# Patient Record
Sex: Female | Born: 1960 | Race: White | Hispanic: No | Marital: Single | Smoking: Current every day smoker
Health system: Southern US, Community
[De-identification: ages and names within clinical notes are randomized; demographics above are authoritative.]

## PROBLEM LIST (undated history)

## (undated) DIAGNOSIS — I10 Essential (primary) hypertension: Secondary | ICD-10-CM

## (undated) DIAGNOSIS — T8859XA Other complications of anesthesia, initial encounter: Secondary | ICD-10-CM

## (undated) DIAGNOSIS — F329 Major depressive disorder, single episode, unspecified: Secondary | ICD-10-CM

## (undated) DIAGNOSIS — F32A Depression, unspecified: Secondary | ICD-10-CM

## (undated) DIAGNOSIS — E785 Hyperlipidemia, unspecified: Secondary | ICD-10-CM

## (undated) HISTORY — PX: TUBAL LIGATION: SHX77

## (undated) HISTORY — DX: Depression, unspecified: F32.A

## (undated) HISTORY — PX: TONSILLECTOMY: SUR1361

## (undated) HISTORY — DX: Hyperlipidemia, unspecified: E78.5

---
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
## 2019-05-08 ENCOUNTER — Other Ambulatory Visit: Payer: Self-pay

## 2019-05-08 ENCOUNTER — Ambulatory Visit
Admission: EM | Admit: 2019-05-08 | Discharge: 2019-05-08 | Disposition: A | Discharge status: Home / Self Care | Payer: BLUE CROSS/BLUE SHIELD | Attending: Emergency Medicine | Admitting: Emergency Medicine

## 2019-05-08 DIAGNOSIS — Z20828 Contact with and (suspected) exposure to other viral communicable diseases: Secondary | ICD-10-CM | POA: Diagnosis not present

## 2019-05-08 DIAGNOSIS — I10 Essential (primary) hypertension: Secondary | ICD-10-CM

## 2019-05-08 DIAGNOSIS — Z20822 Contact with and (suspected) exposure to covid-19: Secondary | ICD-10-CM

## 2019-05-08 DIAGNOSIS — R0602 Shortness of breath: Secondary | ICD-10-CM

## 2019-05-08 HISTORY — DX: Essential (primary) hypertension: I10

## 2019-05-08 LAB — POC SARS CORONAVIRUS 2 AG -  ED: SARS Coronavirus 2 Ag: NEGATIVE

## 2019-05-08 MED ORDER — ALBUTEROL SULFATE HFA 108 (90 BASE) MCG/ACT IN AERS: 1.0000 | INHALATION_SPRAY | Freq: Four times a day (QID) | RESPIRATORY_TRACT | 0 refills | Status: DC | PRN

## 2019-05-08 MED ORDER — PREDNISONE 10 MG (21) PO TBPK: ORAL_TABLET | Freq: Every day | ORAL | 0 refills | Status: DC

## 2019-05-08 MED ORDER — AMLODIPINE BESYLATE 5 MG PO TABS: 5.0000 mg | ORAL_TABLET | Freq: Every day | ORAL | 1 refills | Status: DC

## 2019-05-08 NOTE — Discharge Instructions (Addendum)
Rapid COVID negative.  Culture sent.  Patient should remain in quarantine until they have received culture results.  If negative you may resume normal activities (go back to work/school) while practicing hand hygiene, social distance, and mask wearing.  If positive, patient should remain in quarantine for 10 days from symptom onset AND greater than 72 hours after symptoms resolution (absence of fever without the use of fever-reducing medication and improvement in respiratory symptoms), whichever is longer Get plenty of rest and push fluids Use OTC zyrtec for nasal congestion, runny nose, and/or sore throat Use OTC flonase for nasal congestion and runny nose Use OTC medications like ibuprofen or tylenol as needed fever or pain Prednisone taper prescribed.  Take as directed and to completion Albuterol inhaler prescribed.  Use as needed for shortness of breath and/or wheezing Follow up with PCP regarding test results and for recheck in a few days Call or go to the ED if you have any new or worsening symptoms such as fever, worsening cough, shortness of breath, chest tightness, chest pain, turning blue, changes in mental status, etc..Marland Kitchen

## 2019-05-08 NOTE — ED Triage Notes (Signed)
Pt presents with c/o fatigue and cough  That developed a couple days ago, pt also states she has had issues with high blood pressure and has been taking her brother n laws losartan

## 2019-05-08 NOTE — ED Provider Notes (Signed)
[facility]   [id number] 05/08/19 Arrival Time: 0998   CC: COVID symptoms; elevated blood pressure  SUBJECTIVE: History from: patient.  Monique Harmon is a 58 y.o. female hx significant for HTN, who presents with fatigue, mildly productive cough, and SOB x couple of days.  Denies sick exposure to COVID, flu or strep.  Denies recent travel.  Has NOT tried OTC medication.  Symptoms are made worse with in the morning.  Reports previous symptoms in the past.   Denies fever, chills, fatigue, sinus pain, rhinorrhea, sore throat, wheezing, chest pain, nausea, changes in bowel or bladder habits.    Patient also mentions concern for elevated blood pressure.  Has been taking brother-in-laws losartan 50 mg with improvement.  Hx of HTN for "years."  Has tried lisinopril, HCTZ, and now losartan.  Does not have a PCP at this time.  States diastolic BP has been as high as 118.  Denies headache, vision changes, chest pain, numbness, tingling, weakness, facial droop, slurred speech.    ROS: As per HPI.  All other pertinent ROS negative.     Past Medical History:  Diagnosis Date  . Hypertension    Past Surgical History:  Procedure Laterality Date  . TUBAL LIGATION     Allergies  Allergen Reactions  . Neosporin [Bacitracin-Polymyxin B]    No current facility-administered medications on file prior to encounter.    No current outpatient medications on file prior to encounter.   Social History   Socioeconomic History  . Marital status: Single    Spouse name: Not on file  . Number of children: Not on file  . Years of education: Not on file  . Highest education level: Not on file  Occupational History  . Not on file  Social Needs  . Financial resource strain: Not on file  . Food insecurity    Worry: Not on file    Inability: Not on file  . Transportation needs    Medical: Not on file    Non-medical: Not on file  Tobacco Use  . Smoking status: Current Every Day Smoker   Packs/day: 1.00    Types: Cigarettes  . Smokeless tobacco: Never Used  Substance and Sexual Activity  . Alcohol use: Yes  . Drug use: Not Currently  . Sexual activity: Not on file  Lifestyle  . Physical activity    Days per week: Not on file    Minutes per session: Not on file  . Stress: Not on file  Relationships  . Social Herbalist on phone: Not on file    Gets together: Not on file    Attends religious service: Not on file    Active member of club or organization: Not on file    Attends meetings of clubs or organizations: Not on file    Relationship status: Not on file  . Intimate partner violence    Fear of current or ex partner: Not on file    Emotionally abused: Not on file    Physically abused: Not on file    Forced sexual activity: Not on file  Other Topics Concern  . Not on file  Social History Narrative  . Not on file   Family History  Problem Relation Age of Onset  . Cancer Mother   . COPD Mother   . Diabetes Mother   . COPD Father     OBJECTIVE:  Vitals:   05/08/19 1525  BP: (!) 134/91  Pulse: 97  Resp: 18  Temp: 99 F (37.2 C)  SpO2: 90%     General appearance: alert; well-appearing nontoxic; speaking in full sentences and tolerating own secretions HEENT: NCAT; Ears: EACs clear, TMs pearly gray; Eyes: PERRL.  EOM grossly intact. Nose: nares patent without rhinorrhea, Throat: oropharynx clear, tonsils non erythematous or enlarged, uvula midline  Neck: supple without LAD Lungs: unlabored respirations, symmetrical air entry; cough: mild; no respiratory distress; decreased breath sounds throughout Heart: regular rate and rhythm.   Skin: warm and dry Psychological: alert and cooperative; normal mood and affect  LABS:  Results for orders placed or performed during the hospital encounter of 05/08/19 (from the past 24 hour(s))  POC SARS Coronavirus 2 Ag-ED - Nasal Swab (BD Veritor Kit)     Status: None   Collection Time: 05/08/19  3:39 PM   Result Value Ref Range   SARS Coronavirus 2 Ag Negative Negative     ASSESSMENT & PLAN:  1. Suspected COVID-19 virus infection   2. Shortness of breath   3. Essential hypertension     Meds ordered this encounter  Medications  . predniSONE (STERAPRED UNI-PAK 21 TAB) 10 MG (21) TBPK tablet    Sig: Take by mouth daily. Take 6 tabs by mouth daily  for 2 days, then 5 tabs for 2 days, then 4 tabs for 2 days, then 3 tabs for 2 days, 2 tabs for 2 days, then 1 tab by mouth daily for 2 days    Dispense:  42 tablet    Refill:  0    Order Specific Question:   Supervising Provider    Answer:   Raylene Everts [[id number]]  . albuterol (VENTOLIN HFA) 108 (90 Base) MCG/ACT inhaler    Sig: Inhale 1-2 puffs into the lungs every 6 (six) hours as needed for wheezing or shortness of breath.    Dispense:  18 g    Refill:  0    Order Specific Question:   Supervising Provider    Answer:   Raylene Everts [[id number]]  . amLODipine (NORVASC) 5 MG tablet    Sig: Take 1 tablet (5 mg total) by mouth daily.    Dispense:  30 tablet    Refill:  1    Order Specific Question:   Supervising Provider    Answer:   Raylene Everts [[id number]]   COVID symptoms:  Rapid COVID negative.  Culture sent.  Patient should remain in quarantine until they have received culture results.  If negative you may resume normal activities (go back to work/school) while practicing hand hygiene, social distance, and mask wearing.  If positive, patient should remain in quarantine for 10 days from symptom onset AND greater than 72 hours after symptoms resolution (absence of fever without the use of fever-reducing medication and improvement in respiratory symptoms), whichever is longer Get plenty of rest and push fluids Use OTC zyrtec for nasal congestion, runny nose, and/or sore throat Use OTC flonase for nasal congestion and runny nose Use OTC medications like ibuprofen or tylenol as needed fever or pain Prednisone taper prescribed.   Take as directed and to completion Albuterol inhaler prescribed.  Use as needed for shortness of breath and/or wheezing Follow up with PCP regarding test results and for recheck in a few days Call or go to the ED if you have any new or worsening symptoms such as fever, worsening cough, shortness of breath, chest tightness, chest pain, turning blue, changes in mental status, etc...    HTN:  Instructed patient on the following: Please continue to monitor blood pressure at home and keep a log Declines blood work today Will start her out on amlodipine daily. Stress importance of establishing care with PCP for further management of elevated blood pressure.  Patient aware and in agreement, will try to make appt with Dr. Holly Bodily in the next few weeks Return or go to the ED if you have any new or worsening symptoms such as vision changes, fatigue, dizziness, chest pain, shortness of breath, nausea, swelling in your hands or feet, urinary symptoms, etc...    Reviewed expectations re: course of current medical issues. Questions answered. Outlined signs and symptoms indicating need for more acute intervention. Patient verbalized understanding. After Visit Summary given.         Lestine Box, PA-C 05/08/19 1623

## 2019-05-09 LAB — NOVEL CORONAVIRUS, NAA: SARS-CoV-2, NAA: NOT DETECTED

## 2019-08-09 ENCOUNTER — Ambulatory Visit: Payer: Medicaid Other | Admitting: Physician Assistant

## 2019-08-09 ENCOUNTER — Encounter: Payer: Self-pay | Admitting: Physician Assistant

## 2019-08-09 ENCOUNTER — Other Ambulatory Visit: Payer: Self-pay

## 2019-08-09 VITALS — BP 146/88 | HR 88 | Temp 98.1°F | Wt 206.6 lb

## 2019-08-09 DIAGNOSIS — Z13220 Encounter for screening for lipoid disorders: Secondary | ICD-10-CM

## 2019-08-09 DIAGNOSIS — Z1211 Encounter for screening for malignant neoplasm of colon: Secondary | ICD-10-CM

## 2019-08-09 DIAGNOSIS — Z5320 Procedure and treatment not carried out because of patient's decision for unspecified reasons: Secondary | ICD-10-CM

## 2019-08-09 DIAGNOSIS — F17200 Nicotine dependence, unspecified, uncomplicated: Secondary | ICD-10-CM

## 2019-08-09 DIAGNOSIS — I10 Essential (primary) hypertension: Secondary | ICD-10-CM

## 2019-08-09 DIAGNOSIS — E669 Obesity, unspecified: Secondary | ICD-10-CM

## 2019-08-09 DIAGNOSIS — Z2821 Immunization not carried out because of patient refusal: Secondary | ICD-10-CM

## 2019-08-09 DIAGNOSIS — F1090 Alcohol use, unspecified, uncomplicated: Secondary | ICD-10-CM

## 2019-08-09 DIAGNOSIS — Z131 Encounter for screening for diabetes mellitus: Secondary | ICD-10-CM

## 2019-08-09 DIAGNOSIS — Z1239 Encounter for other screening for malignant neoplasm of breast: Secondary | ICD-10-CM

## 2019-08-09 DIAGNOSIS — Z789 Other specified health status: Secondary | ICD-10-CM

## 2019-08-09 DIAGNOSIS — Z7689 Persons encountering health services in other specified circumstances: Secondary | ICD-10-CM

## 2019-08-09 MED ORDER — AMLODIPINE BESYLATE 5 MG PO TABS: 5.0000 mg | ORAL_TABLET | Freq: Every day | ORAL | 1 refills | Status: DC

## 2019-08-09 NOTE — Patient Instructions (Signed)
Varenicline oral tablets What is this medicine? VARENICLINE (var e NI kleen) is used to help people quit smoking. It is used with a patient support program recommended by your physician. This medicine may be used for other purposes; ask your health care provider or pharmacist if you have questions. COMMON BRAND NAME(S): Chantix What should I tell my health care provider before I take this medicine? They need to know if you have any of these conditions:  heart disease  if you often drink alcohol  kidney disease  mental illness  on hemodialysis  seizures  history of stroke  suicidal thoughts, plans, or attempt; a previous suicide attempt by you or a family member  an unusual or allergic reaction to varenicline, other medicines, foods, dyes, or preservatives  pregnant or trying to get pregnant  breast-feeding How should I use this medicine? Take this medicine by mouth after eating. Take with a full glass of water. Follow the directions on the prescription label. Take your doses at regular intervals. Do not take your medicine more often than directed. There are 3 ways you can use this medicine to help you quit smoking; talk to your health care professional to decide which plan is right for you: 1) you can choose a quit date and start this medicine 1 week before the quit date, or, 2) you can start taking this medicine before you choose a quit date, and then pick a quit date between day 8 and 35 days of treatment, or, 3) if you are not sure that you are able or willing to quit smoking right away, start taking this medicine and slowly decrease the amount you smoke as directed by your health care professional with the goal of being cigarette-free by week 12 of treatment. Stick to your plan; ask about support groups or other ways to help you remain cigarette-free. If you are motivated to quit smoking and did not succeed during a previous attempt with this medicine for reasons other than  side effects, or if you returned to smoking after this treatment, speak with your health care professional about whether another course of this medicine may be right for you. A special MedGuide will be given to you by the pharmacist with each prescription and refill. Be sure to read this information carefully each time. Talk to your pediatrician regarding the use of this medicine in children. This medicine is not approved for use in children. Overdosage: If you think you have taken too much of this medicine contact a poison control center or emergency room at once. NOTE: This medicine is only for you. Do not share this medicine with others. What if I miss a dose? If you miss a dose, take it as soon as you can. If it is almost time for your next dose, take only that dose. Do not take double or extra doses. What may interact with this medicine?  alcohol  insulin  other medicines used to help people quit smoking  theophylline  warfarin This list may not describe all possible interactions. Give your health care provider a list of all the medicines, herbs, non-prescription drugs, or dietary supplements you use. Also tell them if you smoke, drink alcohol, or use illegal drugs. Some items may interact with your medicine. What should I watch for while using this medicine? It is okay if you do not succeed at your attempt to quit and have a cigarette. You can still continue your quit attempt and keep using this medicine as directed.   Just throw away your cigarettes and get back to your quit plan. Talk to your health care provider before using other treatments to quit smoking. Using this medicine with other treatments to quit smoking may increase the risk for side effects compared to using a treatment alone. You may get drowsy or dizzy. Do not drive, use machinery, or do anything that needs mental alertness until you know how this medicine affects you. Do not stand or sit up quickly, especially if you are  an older patient. This reduces the risk of dizzy or fainting spells. Decrease the number of alcoholic beverages that you drink during treatment with this medicine until you know if this medicine affects your ability to tolerate alcohol. Some people have experienced increased drunkenness (intoxication), unusual or sometimes aggressive behavior, or no memory of things that have happened (amnesia) during treatment with this medicine. Sleepwalking can happen during treatment with this medicine, and can sometimes lead to behavior that is harmful to you, other people, or property. Stop taking this medicine and tell your doctor if you start sleepwalking or have other unusual sleep-related activity. After taking this medicine, you may get up out of bed and do an activity that you do not know you are doing. The next morning, you may have no memory of this. Activities include driving a car ("sleep-driving"), making and eating food, talking on the phone, sexual activity, and sleep-walking. Serious injuries have occurred. Stop the medicine and call your doctor right away if you find out you have done any of these activities. Do not take this medicine if you have used alcohol that evening. Do not take it if you have taken another medicine for sleep. The risk of doing these sleep-related activities is higher. Patients and their families should watch out for new or worsening depression or thoughts of suicide. Also watch out for sudden changes in feelings such as feeling anxious, agitated, panicky, irritable, hostile, aggressive, impulsive, severely restless, overly excited and hyperactive, or not being able to sleep. If this happens, call your health care professional. If you have diabetes and you quit smoking, the effects of insulin may be increased and you may need to reduce your insulin dose. Check with your doctor or health care professional about how you should adjust your insulin dose. What side effects may I notice  from receiving this medicine? Side effects that you should report to your doctor or health care professional as soon as possible:  allergic reactions like skin rash, itching or hives, swelling of the face, lips, tongue, or throat  acting aggressive, being angry or violent, or acting on dangerous impulses  breathing problems  changes in emotions or moods  chest pain or chest tightness  feeling faint or lightheaded, falls  hallucination, loss of contact with reality  mouth sores  redness, blistering, peeling or loosening of the skin, including inside the mouth  signs and symptoms of a stroke like changes in vision; confusion; trouble speaking or understanding; severe headaches; sudden numbness or weakness of the face, arm or leg; trouble walking; dizziness; loss of balance or coordination  seizures  sleepwalking  suicidal thoughts or other mood changes Side effects that usually do not require medical attention (report to your doctor or health care professional if they continue or are bothersome):  constipation  gas  headache  nausea, vomiting  strange dreams  trouble sleeping This list may not describe all possible side effects. Call your doctor for medical advice about side effects. You may report side   effects to FDA at 1-800-FDA-1088. Where should I keep my medicine? Keep out of the reach of children. Store at room temperature between 15 and 30 degrees C (59 and 86 degrees F). Throw away any unused medicine after the expiration date. NOTE: This sheet is a summary. It may not cover all possible information. If you have questions about this medicine, talk to your doctor, pharmacist, or health care provider.  2020 Elsevier/Gold Standard (2018-05-05 14:27:36)  

## 2019-08-09 NOTE — Progress Notes (Signed)
BP (!) 146/88   Pulse 88   Temp 98.1 F (36.7 C)   Wt 206 lb 9.6 oz (93.7 kg)   SpO2 91%    Subjective:    Patient ID: Monique Harmon, female    DOB: 1960/11/26, 59 y.o.   MRN: [id number]  HPI: Monique Harmon is a 59 y.o. female presenting on 08/09/2019 for New Patient (Initial Visit) (previous pt in office in [place] pt moved to the area July 2020 and has not been anywhere other than an urgent care in Jan. 2021)   HPI  Pt had a negative covid 19 screening questionnaire    Pt is a 58yoF who presents to office today to establish care.  She Works at [employer] at [employer] on [address]    Pt was on HTN medication but she ran out.  She was on lisinopril in the past and it made her cough.   Pt says she was on losartan but she thought it bothered her breathing.  She was seen for this at urgent care in December.  Notes reviewed and discussed with pt that it is more likely she had a cold or some bronchitis.  Pt states understanding  Pt Drinks 4-7 beer /day  She states No mammogram for  6 years, No pap in over 10 year and she has never had a colonoscopy.  Pt reports a numb spot on her left lateral thigh that is about the size of her hand (open).  She denies injury to the area. It does not hurt.     Relevant past medical, surgical, family and social history reviewed and updated as indicated. Interim medical history since our last visit reviewed. Allergies and medications reviewed and updated.   CURRENT MEDS: none   Review of Systems  Per HPI unless specifically indicated above     Objective:    BP (!) 146/88   Pulse 88   Temp 98.1 F (36.7 C)   Wt 206 lb 9.6 oz (93.7 kg)   SpO2 91%   Wt Readings from Last 3 Encounters:  08/09/19 206 lb 9.6 oz (93.7 kg)    Physical Exam Vitals reviewed.  Constitutional:      General: She is not in acute distress.    Appearance: She is well-developed. She is obese. She is not ill-appearing.  HENT:     Head: Normocephalic  and atraumatic.  Eyes:     Conjunctiva/sclera: Conjunctivae normal.     Pupils: Pupils are equal, round, and reactive to light.  Neck:     Thyroid: No thyromegaly.  Cardiovascular:     Rate and Rhythm: Normal rate and regular rhythm.     Pulses:          Dorsalis pedis pulses are 2+ on the right side and 2+ on the left side.  Pulmonary:     Effort: Pulmonary effort is normal.     Breath sounds: Normal breath sounds.  Abdominal:     General: Bowel sounds are normal.     Palpations: Abdomen is soft. There is no mass.     Tenderness: There is no abdominal tenderness.  Musculoskeletal:     Cervical back: Neck supple.     Right upper leg: Normal.     Left upper leg: Normal. No swelling, edema, deformity, lacerations, tenderness or bony tenderness.     Left knee: Normal.     Right lower leg: No edema.     Left lower leg: Normal. No edema.  Comments: There is a small area approx 3 inch diameter on left lateral thigh with diminished sensation to light touch.  There is no bruising or other abnormality.  Lymphadenopathy:     Cervical: No cervical adenopathy.  Skin:    General: Skin is warm and dry.  Neurological:     Mental Status: She is alert and oriented to person, place, and time.     Gait: Gait normal.  Psychiatric:        Attention and Perception: Attention normal.        Speech: Speech normal.        Behavior: Behavior normal. Behavior is cooperative.     Comments: Pt somewhat reluctant ie she didn't want to remove pants, socks and shoes for evaluation of her "numb spot" but did after somewhat lengthy explanation as to why this was necessary.           Assessment & Plan:   Encounter Diagnoses  Name Primary?  . Encounter to establish care Yes  . Essential hypertension   . Heavy alcohol consumption   . Tobacco use disorder   . Obesity, unspecified classification, unspecified obesity type, unspecified whether serious comorbidity present   . Encounter for screening  for malignant neoplasm of breast, unspecified screening modality   . Screening for colon cancer   . COVID-19 virus vaccination declined   . Screening cholesterol level   . Screening for diabetes mellitus   . Papanicolaou smear declined       -rx amlodipne -will get Baseline labs -will refer for screening Mammogram -pt is given ifobt for colon cancer screening -Pt declined covid vaccination -will Update pap soon but pt doesn't want to do it at follow up appointment in one month. -urged pt to decrease etoh.  Discussed risks of heavy drinking.  Recommended no more than 2/day and would be better to drink no more than 2 or 3 days/week.  Discussed no etoh would be best option. -encouraged smoking cessation.  Pt asks about chantix (on way out the door).  Will discuss further at next appointment.  She is given reading information on chantix. -pt to follow up 1 month.  She is to contact office sooner prn

## 2019-08-10 ENCOUNTER — Other Ambulatory Visit: Payer: Self-pay | Admitting: Physician Assistant

## 2019-08-10 DIAGNOSIS — Z1211 Encounter for screening for malignant neoplasm of colon: Secondary | ICD-10-CM

## 2019-08-19 ENCOUNTER — Other Ambulatory Visit: Payer: Self-pay | Admitting: Student

## 2019-08-19 DIAGNOSIS — Z1239 Encounter for other screening for malignant neoplasm of breast: Secondary | ICD-10-CM

## 2019-08-29 ENCOUNTER — Other Ambulatory Visit: Payer: Self-pay

## 2019-08-29 ENCOUNTER — Other Ambulatory Visit (HOSPITAL_COMMUNITY)
Admission: RE | Admit: 2019-08-29 | Discharge: 2019-08-29 | Disposition: A | Discharge status: Home / Self Care | Payer: Medicaid Other | Source: Ambulatory Visit | Attending: Physician Assistant | Admitting: Physician Assistant

## 2019-08-29 DIAGNOSIS — Z789 Other specified health status: Secondary | ICD-10-CM | POA: Insufficient documentation

## 2019-08-29 DIAGNOSIS — I10 Essential (primary) hypertension: Secondary | ICD-10-CM | POA: Insufficient documentation

## 2019-08-29 DIAGNOSIS — Z131 Encounter for screening for diabetes mellitus: Secondary | ICD-10-CM | POA: Insufficient documentation

## 2019-08-29 DIAGNOSIS — F1090 Alcohol use, unspecified, uncomplicated: Secondary | ICD-10-CM

## 2019-08-29 DIAGNOSIS — Z13220 Encounter for screening for lipoid disorders: Secondary | ICD-10-CM

## 2019-08-29 LAB — COMPREHENSIVE METABOLIC PANEL
ALT: 22 U/L (ref 0–44)
AST: 19 U/L (ref 15–41)
Albumin: 4.1 g/dL (ref 3.5–5.0)
Alkaline Phosphatase: 68 U/L (ref 38–126)
Anion gap: 11 (ref 5–15)
BUN: 13 mg/dL (ref 6–20)
CO2: 30 mmol/L (ref 22–32)
Calcium: 9.2 mg/dL (ref 8.9–10.3)
Chloride: 97 mmol/L — ABNORMAL LOW (ref 98–111)
Creatinine, Ser: 0.62 mg/dL (ref 0.44–1.00)
GFR calc Af Amer: >60 mL/min (ref >60)
GFR calc non Af Amer: >60 mL/min (ref >60)
Glucose, Bld: 135 mg/dL — ABNORMAL HIGH (ref 70–99)
Potassium: 4.3 mmol/L (ref 3.5–5.1)
Sodium: 138 mmol/L (ref 135–145)
Total Bilirubin: 0.6 mg/dL (ref 0.3–1.2)
Total Protein: 7.3 g/dL (ref 6.5–8.1)

## 2019-08-29 LAB — LIPID PANEL
Cholesterol: 224 mg/dL — ABNORMAL HIGH (ref 0–200)
HDL: 59 mg/dL (ref >40)
LDL Cholesterol: 138 mg/dL — ABNORMAL HIGH (ref 0–99)
Total CHOL/HDL Ratio: 3.8 RATIO
Triglycerides: 137 mg/dL (ref <150)
VLDL: 27 mg/dL (ref 0–40)

## 2019-08-29 LAB — HEMOGLOBIN A1C
Hgb A1c MFr Bld: 5.9 % — ABNORMAL HIGH (ref 4.8–5.6)
Mean Plasma Glucose: 122.63 mg/dL

## 2019-08-31 ENCOUNTER — Ambulatory Visit (HOSPITAL_COMMUNITY)
Admission: RE | Admit: 2019-08-31 | Discharge: 2019-08-31 | Disposition: A | Discharge status: Home / Self Care | Payer: Self-pay | Source: Ambulatory Visit | Attending: Physician Assistant | Admitting: Physician Assistant

## 2019-08-31 ENCOUNTER — Other Ambulatory Visit: Payer: Self-pay

## 2019-08-31 DIAGNOSIS — Z1239 Encounter for other screening for malignant neoplasm of breast: Secondary | ICD-10-CM | POA: Insufficient documentation

## 2019-09-03 ENCOUNTER — Other Ambulatory Visit (HOSPITAL_COMMUNITY): Payer: Self-pay | Admitting: Physician Assistant

## 2019-09-03 DIAGNOSIS — R928 Other abnormal and inconclusive findings on diagnostic imaging of breast: Secondary | ICD-10-CM

## 2019-09-11 ENCOUNTER — Inpatient Hospital Stay
Admission: RE | Admit: 2019-09-11 | Discharge: 2019-09-11 | Disposition: A | Discharge status: Home / Self Care | Payer: Self-pay | Source: Ambulatory Visit | Attending: Physician Assistant | Admitting: Physician Assistant

## 2019-09-11 ENCOUNTER — Other Ambulatory Visit (HOSPITAL_COMMUNITY): Payer: Self-pay | Admitting: Physician Assistant

## 2019-09-11 DIAGNOSIS — R928 Other abnormal and inconclusive findings on diagnostic imaging of breast: Secondary | ICD-10-CM

## 2019-09-19 ENCOUNTER — Ambulatory Visit: Payer: Medicaid Other | Admitting: Physician Assistant

## 2019-09-19 ENCOUNTER — Encounter: Payer: Self-pay | Admitting: Physician Assistant

## 2019-09-19 ENCOUNTER — Other Ambulatory Visit: Payer: Self-pay

## 2019-09-19 VITALS — BP 136/76 | HR 91 | Temp 98.4°F | Ht 64.0 in | Wt 204.3 lb

## 2019-09-19 DIAGNOSIS — I10 Essential (primary) hypertension: Secondary | ICD-10-CM

## 2019-09-19 DIAGNOSIS — E785 Hyperlipidemia, unspecified: Secondary | ICD-10-CM

## 2019-09-19 DIAGNOSIS — Z789 Other specified health status: Secondary | ICD-10-CM

## 2019-09-19 DIAGNOSIS — F17200 Nicotine dependence, unspecified, uncomplicated: Secondary | ICD-10-CM

## 2019-09-19 DIAGNOSIS — E669 Obesity, unspecified: Secondary | ICD-10-CM

## 2019-09-19 DIAGNOSIS — F1090 Alcohol use, unspecified, uncomplicated: Secondary | ICD-10-CM

## 2019-09-19 DIAGNOSIS — R7303 Prediabetes: Secondary | ICD-10-CM

## 2019-09-19 MED ORDER — AMLODIPINE BESYLATE 5 MG PO TABS: 5.0000 mg | ORAL_TABLET | Freq: Every day | ORAL | 1 refills | Status: DC

## 2019-09-19 MED ORDER — ATORVASTATIN CALCIUM 20 MG PO TABS: 20.0000 mg | ORAL_TABLET | Freq: Every day | ORAL | 1 refills | Status: DC

## 2019-09-19 NOTE — Patient Instructions (Addendum)
Varenicline oral tablets     What is this medicine? VARENICLINE (var e NI kleen) is used to help people quit smoking. It is used with a patient support program recommended by your physician. This medicine may be used for other purposes; ask your health care provider or pharmacist if you have questions. COMMON BRAND NAME(S): Chantix What should I tell my health care provider before I take this medicine? They need to know if you have any of these conditions:  heart disease  if you often drink alcohol  kidney disease  mental illness  on hemodialysis  seizures  history of stroke  suicidal thoughts, plans, or attempt; a previous suicide attempt by you or a family member  an unusual or allergic reaction to varenicline, other medicines, foods, dyes, or preservatives  pregnant or trying to get pregnant  breast-feeding How should I use this medicine? Take this medicine by mouth after eating. Take with a full glass of water. Follow the directions on the prescription label. Take your doses at regular intervals. Do not take your medicine more often than directed. There are 3 ways you can use this medicine to help you quit smoking; talk to your health care professional to decide which plan is right for you: 1) you can choose a quit date and start this medicine 1 week before the quit date, or, 2) you can start taking this medicine before you choose a quit date, and then pick a quit date between day 8 and 35 days of treatment, or, 3) if you are not sure that you are able or willing to quit smoking right away, start taking this medicine and slowly decrease the amount you smoke as directed by your health care professional with the goal of being cigarette-free by week 12 of treatment. Stick to your plan; ask about support groups or other ways to help you remain cigarette-free. If you are motivated to quit smoking and did not succeed during a previous attempt with this medicine for reasons  other than side effects, or if you returned to smoking after this treatment, speak with your health care professional about whether another course of this medicine may be right for you. A special MedGuide will be given to you by the pharmacist with each prescription and refill. Be sure to read this information carefully each time. Talk to your pediatrician regarding the use of this medicine in children. This medicine is not approved for use in children. Overdosage: If you think you have taken too much of this medicine contact a poison control center or emergency room at once. NOTE: This medicine is only for you. Do not share this medicine with others. What if I miss a dose? If you miss a dose, take it as soon as you can. If it is almost time for your next dose, take only that dose. Do not take double or extra doses. What may interact with this medicine?  alcohol  insulin  other medicines used to help people quit smoking  theophylline  warfarin This list may not describe all possible interactions. Give your health care provider a list of all the medicines, herbs, non-prescription drugs, or dietary supplements you use. Also tell them if you smoke, drink alcohol, or use illegal drugs. Some items may interact with your medicine. What should I watch for while using this medicine? It is okay if you do not succeed at your attempt to quit and have a cigarette. You can still continue your quit  attempt and keep using this medicine as directed. Just throw away your cigarettes and get back to your quit plan. Talk to your health care provider before using other treatments to quit smoking. Using this medicine with other treatments to quit smoking may increase the risk for side effects compared to using a treatment alone. You may get drowsy or dizzy. Do not drive, use machinery, or do anything that needs mental alertness until you know how this medicine affects you. Do not stand or sit up quickly, especially  if you are an older patient. This reduces the risk of dizzy or fainting spells. Decrease the number of alcoholic beverages that you drink during treatment with this medicine until you know if this medicine affects your ability to tolerate alcohol. Some people have experienced increased drunkenness (intoxication), unusual or sometimes aggressive behavior, or no memory of things that have happened (amnesia) during treatment with this medicine. Sleepwalking can happen during treatment with this medicine, and can sometimes lead to behavior that is harmful to you, other people, or property. Stop taking this medicine and tell your doctor if you start sleepwalking or have other unusual sleep-related activity. After taking this medicine, you may get up out of bed and do an activity that you do not know you are doing. The next morning, you may have no memory of this. Activities include driving a car ("sleep-driving"), making and eating food, talking on the phone, sexual activity, and sleep-walking. Serious injuries have occurred. Stop the medicine and call your doctor right away if you find out you have done any of these activities. Do not take this medicine if you have used alcohol that evening. Do not take it if you have taken another medicine for sleep. The risk of doing these sleep-related activities is higher. Patients and their families should watch out for new or worsening depression or thoughts of suicide. Also watch out for sudden changes in feelings such as feeling anxious, agitated, panicky, irritable, hostile, aggressive, impulsive, severely restless, overly excited and hyperactive, or not being able to sleep. If this happens, call your health care professional. If you have diabetes and you quit smoking, the effects of insulin may be increased and you may need to reduce your insulin dose. Check with your doctor or health care professional about how you should adjust your insulin dose. What side effects may  I notice from receiving this medicine? Side effects that you should report to your doctor or health care professional as soon as possible:  allergic reactions like skin rash, itching or hives, swelling of the face, lips, tongue, or throat  acting aggressive, being angry or violent, or acting on dangerous impulses  breathing problems  changes in emotions or moods  chest pain or chest tightness  feeling faint or lightheaded, falls  hallucination, loss of contact with reality  mouth sores  redness, blistering, peeling or loosening of the skin, including inside the mouth  signs and symptoms of a stroke like changes in vision; confusion; trouble speaking or understanding; severe headaches; sudden numbness or weakness of the face, arm or leg; trouble walking; dizziness; loss of balance or coordination  seizures  sleepwalking  suicidal thoughts or other mood changes Side effects that usually do not require medical attention (report to your doctor or health care professional if they continue or are bothersome):  constipation  gas  headache  nausea, vomiting  strange dreams  trouble sleeping This list may not describe all possible side effects. Call your doctor for medical  advice about side effects. You may report side effects to FDA at 1-800-FDA-1088. Where should I keep my medicine? Keep out of the reach of children. Store at room temperature between 15 and 30 degrees C (59 and 86 degrees F). Throw away any unused medicine after the expiration date. NOTE: This sheet is a summary. It may not cover all possible information. If you have questions about this medicine, talk to your doctor, pharmacist, or health care provider.  2020 Elsevier/Gold Standard (2018-05-05 14:27:36)  ----------------------------------------------------------    High Cholesterol  High cholesterol is a condition in which the blood has high levels of a white, waxy, fat-like substance (cholesterol).  The human body needs small amounts of cholesterol. The liver makes all the cholesterol that the body needs. Extra (excess) cholesterol comes from the food that we eat. Cholesterol is carried from the liver by the blood through the blood vessels. If you have high cholesterol, deposits (plaques) may build up on the walls of your blood vessels (arteries). Plaques make the arteries narrower and stiffer. Cholesterol plaques increase your risk for heart attack and stroke. Work with your health care provider to keep your cholesterol levels in a healthy range. What increases the risk? This condition is more likely to develop in people who:  Eat foods that are high in animal fat (saturated fat) or cholesterol.  Are overweight.  Are not getting enough exercise.  Have a family history of high cholesterol. What are the signs or symptoms? There are no symptoms of this condition. How is this diagnosed? This condition may be diagnosed from the results of a blood test.  If you are older than age 90, your health care provider may check your cholesterol every 4-6 years.  You may be checked more often if you already have high cholesterol or other risk factors for heart disease. The blood test for cholesterol measures:  "Bad" cholesterol (LDL cholesterol). This is the main type of cholesterol that causes heart disease. The desired level for LDL is less than 100.  "Good" cholesterol (HDL cholesterol). This type helps to protect against heart disease by cleaning the arteries and carrying the LDL away. The desired level for HDL is 60 or higher.  Triglycerides. These are fats that the body can store or burn for energy. The desired number for triglycerides is lower than 150.  Total cholesterol. This is a measure of the total amount of cholesterol in your blood, including LDL cholesterol, HDL cholesterol, and triglycerides. A healthy number is less than 200. How is this treated? This condition is treated with  diet changes, lifestyle changes, and medicines. Diet changes  This may include eating more whole grains, fruits, vegetables, nuts, and fish.  This may also include cutting back on red meat and foods that have a lot of added sugar. Lifestyle changes  Changes may include getting at least 40 minutes of aerobic exercise 3 times a week. Aerobic exercises include walking, biking, and swimming. Aerobic exercise along with a healthy diet can help you maintain a healthy weight.  Changes may also include quitting smoking. Medicines  Medicines are usually given if diet and lifestyle changes have failed to reduce your cholesterol to healthy levels.  Your health care provider may prescribe a statin medicine. Statin medicines have been shown to reduce cholesterol, which can reduce the risk of heart disease. Follow these instructions at home: Eating and drinking If told by your health care provider:  Eat chicken (without skin), fish, veal, shellfish, ground [place] breast, and  round or loin cuts of red meat.  Do not eat fried foods or fatty meats, such as hot dogs and salami.  Eat plenty of fruits, such as apples.  Eat plenty of vegetables, such as broccoli, potatoes, and carrots.  Eat beans, peas, and lentils.  Eat grains such as barley, rice, couscous, and bulgur wheat.  Eat pasta without cream sauces.  Use skim or nonfat milk, and eat low-fat or nonfat yogurt and cheeses.  Do not eat or drink whole milk, cream, ice cream, egg yolks, or hard cheeses.  Do not eat stick margarine or tub margarines that contain trans fats (also called partially hydrogenated oils).  Do not eat saturated tropical oils, such as coconut oil and palm oil.  Do not eat cakes, cookies, crackers, or other baked goods that contain trans fats.  General instructions  Exercise as directed by your health care provider. Increase your activity level with activities such as gardening, walking, and taking the  stairs.  Take over-the-counter and prescription medicines only as told by your health care provider.  Do not use any products that contain nicotine or tobacco, such as cigarettes and e-cigarettes. If you need help quitting, ask your health care provider.  Keep all follow-up visits as told by your health care provider. This is important. Contact a health care provider if:  You are struggling to maintain a healthy diet or weight.  You need help to start on an exercise program.  You need help to stop smoking. Get help right away if:  You have chest pain.  You have trouble breathing. This information is not intended to replace advice given to you by your health care provider. Make sure you discuss any questions you have with your health care provider. Document Revised: 05/20/2017 Document Reviewed: 11/15/2015 Elsevier Patient Education  [place].

## 2019-09-19 NOTE — Progress Notes (Signed)
BP 136/76   Pulse 91   Temp 98.4 F (36.9 C)   Ht 5\' 4"  (1.626 m)   Wt 204 lb 4.8 oz (92.7 kg)   SpO2 99%   BMI 35.07 kg/m    Subjective:    Patient ID: Monique Harmon, female    DOB: [birth year], 59 y.o.   MRN: [id number]  HPI: Monique Harmon is a 59 y.o. female presenting on 09/19/2019 for No chief complaint on file.   HPI  Pt had a negative covid 19 screening questionnaire.   Pt is 67yoF with HTN.  She is in office today to recheck bp and review labs.  She is feeling well today.    Relevant past medical, surgical, family and social history reviewed and updated as indicated. Interim medical history since our last visit reviewed. Allergies and medications reviewed and updated.     Current Outpatient Medications:  .  albuterol (VENTOLIN HFA) 108 (90 Base) MCG/ACT inhaler, Inhale 1-2 puffs into the lungs every 6 (six) hours as needed for wheezing or shortness of breath., Disp: 18 g, Rfl: 0 .  amLODipine (NORVASC) 5 MG tablet, Take 1 tablet (5 mg total) by mouth daily., Disp: 30 tablet, Rfl: 1 .  cetirizine (ZYRTEC) 10 MG tablet, Take 10 mg by mouth daily., Disp: , Rfl:     Review of Systems  Per HPI unless specifically indicated above     Objective:    BP 136/76   Pulse 91   Temp 98.4 F (36.9 C)   Ht 5\' 4"  (1.626 m)   Wt 204 lb 4.8 oz (92.7 kg)   SpO2 99%   BMI 35.07 kg/m   Wt Readings from Last 3 Encounters:  09/19/19 204 lb 4.8 oz (92.7 kg)  08/09/19 206 lb 9.6 oz (93.7 kg)    Physical Exam Vitals reviewed.  Constitutional:      General: She is not in acute distress.    Appearance: She is well-developed. She is not ill-appearing.  HENT:     Head: Normocephalic and atraumatic.  Cardiovascular:     Rate and Rhythm: Normal rate and regular rhythm.  Pulmonary:     Effort: Pulmonary effort is normal.     Breath sounds: Normal breath sounds.  Abdominal:     General: Bowel sounds are normal.     Palpations: Abdomen is soft. There is no mass.   Tenderness: There is no abdominal tenderness.  Musculoskeletal:     Cervical back: Neck supple.     Right lower leg: No edema.     Left lower leg: No edema.  Lymphadenopathy:     Cervical: No cervical adenopathy.  Skin:    General: Skin is warm and dry.  Neurological:     Mental Status: She is alert and oriented to person, place, and time.  Psychiatric:        Attention and Perception: Attention normal.        Speech: Speech normal.        Behavior: Behavior normal. Behavior is cooperative.     Results for orders placed or performed during the hospital encounter of 08/29/19  Hemoglobin A1c  Result Value Ref Range   Hgb A1c MFr Bld 5.9 (H) 4.8 - 5.6 %   Mean Plasma Glucose 122.63 mg/dL  Lipid panel  Result Value Ref Range   Cholesterol 224 (H) 0 - 200 mg/dL   Triglycerides 137 <150 mg/dL   HDL 59 >40 mg/dL   Total CHOL/HDL Ratio 3.8 RATIO  VLDL 27 0 - 40 mg/dL   LDL Cholesterol 259 (H) 0 - 99 mg/dL  Comprehensive metabolic panel  Result Value Ref Range   Sodium 138 135 - 145 mmol/L   Potassium 4.3 3.5 - 5.1 mmol/L   Chloride 97 (L) 98 - 111 mmol/L   CO2 30 22 - 32 mmol/L   Glucose, Bld 135 (H) 70 - 99 mg/dL   BUN 13 6 - 20 mg/dL   Creatinine, Ser 5.63 0.44 - 1.00 mg/dL   Calcium 9.2 8.9 - 87.5 mg/dL   Total Protein 7.3 6.5 - 8.1 g/dL   Albumin 4.1 3.5 - 5.0 g/dL   AST 19 15 - 41 U/L   ALT 22 0 - 44 U/L   Alkaline Phosphatase 68 38 - 126 U/L   Total Bilirubin 0.6 0.3 - 1.2 mg/dL   GFR calc non Af Amer >60 >60 mL/min   GFR calc Af Amer >60 >60 mL/min   Anion gap 11 5 - 15      Assessment & Plan:    Encounter Diagnoses  Name Primary?  . Essential hypertension Yes  . Hyperlipidemia, unspecified hyperlipidemia type   . Heavy alcohol consumption   . Tobacco use disorder   . Obesity, unspecified classification, unspecified obesity type, unspecified whether serious comorbidity present   . Prediabetes       -Reviewed labs with pt -pt to Continue current  amlodipine -Add lipitor.  Pt is counseled on lowfat diet and given reading information -Will not send rx for proair because she says she not using it -Pt to return iFOBT that was given to her in march -Counseled smoking cessation.  Discussed chantix.  Pt is given reading inforatmion.  Discussed with pt that her etoh consumption could be an issue as you're not supposed to drink while taking chantix -Pt to follow up in 3 months.  She is to contact office sooner prn  If she decides she wants to try chantix, she is to call for virtual appointment

## 2019-10-04 ENCOUNTER — Ambulatory Visit: Payer: Medicaid Other

## 2019-10-04 DIAGNOSIS — Z20822 Contact with and (suspected) exposure to covid-19: Secondary | ICD-10-CM

## 2019-10-05 LAB — SARS-COV-2, NAA 2 DAY TAT

## 2019-10-05 LAB — NOVEL CORONAVIRUS, NAA: SARS-CoV-2, NAA: NOT DETECTED

## 2019-10-24 ENCOUNTER — Other Ambulatory Visit: Payer: Self-pay | Admitting: Physician Assistant

## 2019-11-13 ENCOUNTER — Other Ambulatory Visit: Payer: Self-pay | Admitting: Physician Assistant

## 2019-11-13 MED ORDER — AMLODIPINE BESYLATE 5 MG PO TABS: 5.0000 mg | ORAL_TABLET | Freq: Every day | ORAL | 1 refills | Status: DC

## 2019-11-13 MED ORDER — ATORVASTATIN CALCIUM 20 MG PO TABS: 20.0000 mg | ORAL_TABLET | Freq: Every day | ORAL | 1 refills | Status: DC

## 2019-11-13 MED ORDER — CETIRIZINE HCL 10 MG PO TABS: 10.0000 mg | ORAL_TABLET | Freq: Every day | ORAL | 1 refills | Status: DC

## 2019-12-19 ENCOUNTER — Other Ambulatory Visit: Payer: Self-pay

## 2019-12-19 ENCOUNTER — Ambulatory Visit: Payer: Medicaid Other | Admitting: Physician Assistant

## 2019-12-19 VITALS — BP 136/84 | HR 104 | Temp 97.9°F | Ht 64.0 in | Wt 205.2 lb

## 2019-12-19 DIAGNOSIS — F17200 Nicotine dependence, unspecified, uncomplicated: Secondary | ICD-10-CM

## 2019-12-19 DIAGNOSIS — E669 Obesity, unspecified: Secondary | ICD-10-CM

## 2019-12-19 DIAGNOSIS — I10 Essential (primary) hypertension: Secondary | ICD-10-CM

## 2019-12-19 DIAGNOSIS — E785 Hyperlipidemia, unspecified: Secondary | ICD-10-CM

## 2019-12-19 NOTE — Progress Notes (Signed)
BP 136/84   Pulse (!) 104   Temp 97.9 F (36.6 C)   Ht 5\' 4"  (1.626 m)   Wt 205 lb 3.2 oz (93.1 kg)   SpO2 93%   BMI 35.22 kg/m    Subjective:    Patient ID: Monique Harmon, female    DOB: 1960-08-19, 59 y.o.   MRN: [id number]  HPI: Monique Harmon is a 59 y.o. female presenting on 12/19/2019 for Hypertension and Hyperlipidemia   HPI  Pt had a negative covid 19 screening questionnaire.     Pt is 58yoF with appointment today for routine follow up of HTN an dyslipidemia.   Pt has not been vaccinated against covid.  She Doesn't use her MDI often, maybe only 2 times since had it.   She Didn't get labs done. she says she is still working on paying for first labs  She Still hasn't returned ifobt for colon cancer screening.  She says she has it At home .      Relevant past medical, surgical, family and social history reviewed and updated as indicated. Interim medical history since our last visit reviewed. Allergies and medications reviewed and updated.   Current Outpatient Medications:  .  albuterol (VENTOLIN HFA) 108 (90 Base) MCG/ACT inhaler, Inhale 1-2 puffs into the lungs every 6 (six) hours as needed for wheezing or shortness of breath., Disp: 18 g, Rfl: 0 .  amLODipine (NORVASC) 5 MG tablet, Take 1 tablet (5 mg total) by mouth daily., Disp: 90 tablet, Rfl: 1 .  atorvastatin (LIPITOR) 20 MG tablet, Take 1 tablet (20 mg total) by mouth daily., Disp: 90 tablet, Rfl: 1 .  cetirizine (ZYRTEC) 10 MG tablet, Take 1 tablet (10 mg total) by mouth daily., Disp: 90 tablet, Rfl: 1    Review of Systems  Per HPI unless specifically indicated above     Objective:    BP 136/84   Pulse (!) 104   Temp 97.9 F (36.6 C)   Ht 5\' 4"  (1.626 m)   Wt 205 lb 3.2 oz (93.1 kg)   SpO2 93%   BMI 35.22 kg/m   Wt Readings from Last 3 Encounters:  12/19/19 205 lb 3.2 oz (93.1 kg)  09/19/19 204 lb 4.8 oz (92.7 kg)  08/09/19 206 lb 9.6 oz (93.7 kg)    Physical Exam Vitals reviewed.   Constitutional:      General: She is not in acute distress.    Appearance: She is well-developed.  HENT:     Head: Normocephalic and atraumatic.  Cardiovascular:     Rate and Rhythm: Normal rate and regular rhythm.  Pulmonary:     Effort: Pulmonary effort is normal.     Breath sounds: Normal breath sounds.  Abdominal:     General: Bowel sounds are normal.     Palpations: Abdomen is soft. There is no mass.     Tenderness: There is no abdominal tenderness.  Musculoskeletal:     Cervical back: Neck supple.     Right lower leg: No edema.     Left lower leg: No edema.  Lymphadenopathy:     Cervical: No cervical adenopathy.  Skin:    General: Skin is warm and dry.  Neurological:     Mental Status: She is alert and oriented to person, place, and time.  Psychiatric:        Behavior: Behavior normal.          Assessment & Plan:    Encounter Diagnoses  Name Primary?  09/21/19  Essential hypertension Yes  . Hyperlipidemia, unspecified hyperlipidemia type   . Tobacco use disorder   . Obesity, unspecified classification, unspecified obesity type, unspecified whether serious comorbidity present       -discussed with pt reasons for routine labs.  Encouraged her to Get labs drawn.  She will be called with results -encouraged pt to Get covid vaccination -encouraged smoking cesstaion -encouraged pt to Return ifobt for colon cancer screening -pt was given [employer] financial assistance to cover her labs bill and encouraged to submit it in a timely fashion -pt to follow up 3 months.  She is to contact office sooner prn

## 2019-12-26 ENCOUNTER — Encounter: Payer: Self-pay | Admitting: Physician Assistant

## 2020-03-19 ENCOUNTER — Encounter: Payer: Self-pay | Admitting: Physician Assistant

## 2020-03-19 ENCOUNTER — Ambulatory Visit: Payer: Medicaid Other | Admitting: Physician Assistant

## 2020-03-19 ENCOUNTER — Other Ambulatory Visit: Payer: Self-pay

## 2020-03-19 VITALS — BP 157/83 | Temp 97.4°F | Wt 199.7 lb

## 2020-03-19 DIAGNOSIS — I10 Essential (primary) hypertension: Secondary | ICD-10-CM

## 2020-03-19 DIAGNOSIS — E785 Hyperlipidemia, unspecified: Secondary | ICD-10-CM

## 2020-03-19 DIAGNOSIS — F17200 Nicotine dependence, unspecified, uncomplicated: Secondary | ICD-10-CM

## 2020-03-19 MED ORDER — AMLODIPINE BESYLATE 5 MG PO TABS
5.0000 mg | ORAL_TABLET | Freq: Every day | ORAL | 1 refills | Status: DC
Start: 2020-03-19 — End: 2020-04-29

## 2020-03-19 MED ORDER — CETIRIZINE HCL 10 MG PO TABS
10.0000 mg | ORAL_TABLET | Freq: Every day | ORAL | 1 refills | Status: DC
Start: 2020-03-19 — End: 2020-06-10

## 2020-03-19 MED ORDER — LOSARTAN POTASSIUM 50 MG PO TABS: 50.0000 mg | ORAL_TABLET | Freq: Every day | ORAL | 1 refills | Status: DC

## 2020-03-19 NOTE — Progress Notes (Signed)
BP (!) 157/83   Temp (!) 97.4 F (36.3 C)   Wt 199 lb 11.2 oz (90.6 kg)   SpO2 94%   BMI 34.28 kg/m    Subjective:    Patient ID: Monique Harmon, female    DOB: Apr 17, 1961, 59 y.o.   MRN: [id number]  HPI: Monique Harmon is a 59 y.o. female presenting on 03/19/2020 for No chief complaint on file.   HPI   Pt had a negative covid 19 screening questionnaire.    Pt is 59yoF who presents for follow up HTN and dyslipidemia.  She quit taking the atorvastatin because she said it made her feet swell.    She submitted the [employer] financial assistance.   She was given it because she got billed for labs.   The bill for the labs is why she hasn't had blood drawn since March.  She is feeling fine.  She got her covid vaccination.     Relevant past medical, surgical, family and social history reviewed and updated as indicated. Interim medical history since our last visit reviewed. Allergies and medications reviewed and updated.   Current Outpatient Medications:  .  amLODipine (NORVASC) 5 MG tablet, Take 1 tablet (5 mg total) by mouth daily., Disp: 90 tablet, Rfl: 1 .  cetirizine (ZYRTEC) 10 MG tablet, Take 1 tablet (10 mg total) by mouth daily., Disp: 90 tablet, Rfl: 1 .  albuterol (VENTOLIN HFA) 108 (90 Base) MCG/ACT inhaler, Inhale 1-2 puffs into the lungs every 6 (six) hours as needed for wheezing or shortness of breath. (Patient not taking: Reported on 03/19/2020), Disp: 18 g, Rfl: 0 .  atorvastatin (LIPITOR) 20 MG tablet, Take 1 tablet (20 mg total) by mouth daily. (Patient not taking: Reported on 03/19/2020), Disp: 90 tablet, Rfl: 1   Review of Systems  Per HPI unless specifically indicated above     Objective:    BP (!) 157/83   Temp (!) 97.4 F (36.3 C)   Wt 199 lb 11.2 oz (90.6 kg)   SpO2 94%   BMI 34.28 kg/m   Wt Readings from Last 3 Encounters:  03/19/20 199 lb 11.2 oz (90.6 kg)  12/19/19 205 lb 3.2 oz (93.1 kg)  09/19/19 204 lb 4.8 oz (92.7 kg)     Physical Exam Vitals reviewed.  Constitutional:      General: She is not in acute distress.    Appearance: She is well-developed. She is not ill-appearing.  HENT:     Head: Normocephalic and atraumatic.  Cardiovascular:     Rate and Rhythm: Normal rate and regular rhythm.  Pulmonary:     Effort: Pulmonary effort is normal.     Breath sounds: Normal breath sounds.  Abdominal:     General: Bowel sounds are normal.     Palpations: Abdomen is soft. There is no mass.     Tenderness: There is no abdominal tenderness.  Musculoskeletal:     Cervical back: Neck supple.     Right lower leg: No edema.     Left lower leg: No edema.  Lymphadenopathy:     Cervical: No cervical adenopathy.  Skin:    General: Skin is warm and dry.  Neurological:     Mental Status: She is alert and oriented to person, place, and time.  Psychiatric:        Behavior: Behavior normal.           Assessment & Plan:    Encounter Diagnoses  Name Primary?  . Essential  hypertension Yes  . Hyperlipidemia, unspecified hyperlipidemia type   . Tobacco use disorder     -Discussed and pt will start the atorvastatin (since it was likely the amlodipine and not the atorvastatin that caused the swelling) -Will check on cafa -pt remineded to Return iFOBT to office for colon cancer screening -Add  Losartan for poorly controlled HTN -she is to follow up 3 weeks to recheck BP.  She is to contact office sooner for any issues

## 2020-04-29 ENCOUNTER — Other Ambulatory Visit: Payer: Medicaid Other

## 2020-04-29 ENCOUNTER — Ambulatory Visit: Payer: Medicaid Other | Admitting: Physician Assistant

## 2020-04-29 ENCOUNTER — Other Ambulatory Visit: Payer: Self-pay

## 2020-04-29 ENCOUNTER — Encounter: Payer: Self-pay | Admitting: Physician Assistant

## 2020-04-29 DIAGNOSIS — Z20822 Contact with and (suspected) exposure to covid-19: Secondary | ICD-10-CM

## 2020-04-29 DIAGNOSIS — F17200 Nicotine dependence, unspecified, uncomplicated: Secondary | ICD-10-CM

## 2020-04-29 DIAGNOSIS — E669 Obesity, unspecified: Secondary | ICD-10-CM

## 2020-04-29 DIAGNOSIS — E785 Hyperlipidemia, unspecified: Secondary | ICD-10-CM

## 2020-04-29 DIAGNOSIS — R059 Cough, unspecified: Secondary | ICD-10-CM

## 2020-04-29 DIAGNOSIS — I10 Essential (primary) hypertension: Secondary | ICD-10-CM

## 2020-04-29 NOTE — Progress Notes (Signed)
There were no vitals taken for this visit.   Subjective:    Patient ID: Monique Harmon, female    DOB: [birth year], 60 y.o.   MRN: [id number]  HPI: Monique Harmon is a 59 y.o. female presenting on 04/29/2020 for No chief complaint on file.   HPI    This is a telemedicine appointment through [employer] due to coronavirus pandemic.  I connected with  [name] on 04/29/20 by a video enabled telemedicine application and verified that I am speaking with the correct person using two identifiers.   I discussed the limitations of evaluation and management by telemedicine. The patient expressed understanding and agreed to proceed.  Pt is at home.  Provider is at office.      Pt ws scheduled to come into office for follow up HTN but she is sick.    She has been Coughing since last Tuesday but then got worse on Monday(yesterday).  She has a Running nose, A little sob and HA.  She got herself scheduled for a covid test for later today.     She is using her albuterol inhaler.    She says her Swelling is better since she stopped the amlodipine.  She didn't get labs drawn yet.    She has home BP machine but the battery is dead.       Relevant past medical, surgical, family and social history reviewed and updated as indicated. Interim medical history since our last visit reviewed. Allergies and medications reviewed and updated.   Current Outpatient Medications:  .  albuterol (VENTOLIN HFA) 108 (90 Base) MCG/ACT inhaler, Inhale 1-2 puffs into the lungs every 6 (six) hours as needed for wheezing or shortness of breath., Disp: 18 g, Rfl: 0 .  atorvastatin (LIPITOR) 20 MG tablet, Take 1 tablet (20 mg total) by mouth daily., Disp: 90 tablet, Rfl: 1 .  cetirizine (ZYRTEC) 10 MG tablet, Take 1 tablet (10 mg total) by mouth daily., Disp: 90 tablet, Rfl: 1 .  losartan (COZAAR) 50 MG tablet, Take 1 tablet (50 mg total) by mouth daily., Disp: 90 tablet, Rfl: 1     Review of Systems  Per HPI  unless specifically indicated above     Objective:    There were no vitals taken for this visit.  Wt Readings from Last 3 Encounters:  03/19/20 199 lb 11.2 oz (90.6 kg)  12/19/19 205 lb 3.2 oz (93.1 kg)  09/19/19 204 lb 4.8 oz (92.7 kg)    Physical Exam Constitutional:      General: She is not in acute distress.    Appearance: She is not toxic-appearing.  HENT:     Head: Normocephalic and atraumatic.  Pulmonary:     Effort: Pulmonary effort is normal. No respiratory distress.  Neurological:     Mental Status: She is alert and oriented to person, place, and time.  Psychiatric:        Attention and Perception: Attention normal.        Speech: Speech normal.        Behavior: Behavior normal.            Assessment & Plan:    Encounter Diagnoses  Name Primary?  . Person under investigation for COVID-19 Yes  . Cough   . Essential hypertension   . Hyperlipidemia, unspecified hyperlipidemia type   . Tobacco use disorder   . Obesity, unspecified classification, unspecified obesity type, unspecified whether serious comorbidity present        -pt to  get covid test as scheduled.  Self isolate until she gets her results -inhaler prn.  Avoid smoking -continue losartan for bp -will schedule pt to RTO in 2 weeks to check bp (will push it ou if covid +) -pt to contact office sooner for worsening or new symptoms

## 2020-04-30 LAB — NOVEL CORONAVIRUS, NAA: SARS-CoV-2, NAA: NOT DETECTED

## 2020-04-30 LAB — SARS-COV-2, NAA 2 DAY TAT

## 2020-04-30 LAB — SPECIMEN STATUS REPORT

## 2020-05-01 ENCOUNTER — Telehealth: Payer: Self-pay | Admitting: Student

## 2020-05-01 ENCOUNTER — Other Ambulatory Visit: Payer: Self-pay | Admitting: Physician Assistant

## 2020-05-01 MED ORDER — PREDNISONE 20 MG PO TABS: 20.0000 mg | ORAL_TABLET | Freq: Two times a day (BID) | ORAL | 0 refills | Status: DC

## 2020-05-01 NOTE — Telephone Encounter (Signed)
Pt called and left VM stating she needs a notes for work and is requesting a steroid medication because her lungs are congested.  LPN returned call and notified pt of negative COVID results for test done 04-29-20. Pt wants work note emailed to [email or web address].  PA recommends for pt to continue using her albuterol inhaler and recommends smoking cessation. LPN explained to pt that prednisone has side effects and risks. Pt verbalized understanding and agrees with having rx sent to [facility] in [place]..  rx sent to pharmacy and work note with COVID result send to pt's email on 05-01-20.

## 2020-05-26 ENCOUNTER — Other Ambulatory Visit: Payer: Self-pay | Admitting: Physician Assistant

## 2020-05-26 DIAGNOSIS — E785 Hyperlipidemia, unspecified: Secondary | ICD-10-CM

## 2020-05-26 DIAGNOSIS — I10 Essential (primary) hypertension: Secondary | ICD-10-CM

## 2020-05-27 ENCOUNTER — Other Ambulatory Visit: Payer: Self-pay | Admitting: Physician Assistant

## 2020-05-27 DIAGNOSIS — Z1211 Encounter for screening for malignant neoplasm of colon: Secondary | ICD-10-CM

## 2020-05-27 LAB — IFOBT (OCCULT BLOOD): IFOBT: NEGATIVE

## 2020-05-28 ENCOUNTER — Other Ambulatory Visit (HOSPITAL_COMMUNITY)
Admission: RE | Admit: 2020-05-28 | Discharge: 2020-05-28 | Disposition: A | Discharge status: Home / Self Care | Payer: Medicaid Other | Source: Ambulatory Visit | Attending: Physician Assistant | Admitting: Physician Assistant

## 2020-05-28 ENCOUNTER — Other Ambulatory Visit: Payer: Self-pay

## 2020-05-28 DIAGNOSIS — E785 Hyperlipidemia, unspecified: Secondary | ICD-10-CM

## 2020-05-28 DIAGNOSIS — I10 Essential (primary) hypertension: Secondary | ICD-10-CM

## 2020-05-28 LAB — LIPID PANEL
Cholesterol: 214 mg/dL — ABNORMAL HIGH (ref 0–200)
HDL: 61 mg/dL (ref >40)
LDL Cholesterol: 124 mg/dL — ABNORMAL HIGH (ref 0–99)
Total CHOL/HDL Ratio: 3.5 RATIO
Triglycerides: 145 mg/dL (ref <150)
VLDL: 29 mg/dL (ref 0–40)

## 2020-05-28 LAB — COMPREHENSIVE METABOLIC PANEL
ALT: 21 U/L (ref 0–44)
AST: 21 U/L (ref 15–41)
Albumin: 4 g/dL (ref 3.5–5.0)
Alkaline Phosphatase: 64 U/L (ref 38–126)
Anion gap: 9 (ref 5–15)
BUN: 10 mg/dL (ref 6–20)
CO2: 30 mmol/L (ref 22–32)
Calcium: 9.1 mg/dL (ref 8.9–10.3)
Chloride: 98 mmol/L (ref 98–111)
Creatinine, Ser: 0.61 mg/dL (ref 0.44–1.00)
GFR, Estimated: >60 mL/min (ref >60)
Glucose, Bld: 114 mg/dL — ABNORMAL HIGH (ref 70–99)
Potassium: 5.1 mmol/L (ref 3.5–5.1)
Sodium: 137 mmol/L (ref 135–145)
Total Bilirubin: 0.5 mg/dL (ref 0.3–1.2)
Total Protein: 7.1 g/dL (ref 6.5–8.1)

## 2020-06-10 ENCOUNTER — Encounter: Payer: Self-pay | Admitting: Physician Assistant

## 2020-06-10 ENCOUNTER — Ambulatory Visit: Payer: Medicaid Other | Admitting: Physician Assistant

## 2020-06-10 VITALS — BP 148/83 | HR 91 | Temp 97.7°F

## 2020-06-10 DIAGNOSIS — I10 Essential (primary) hypertension: Secondary | ICD-10-CM

## 2020-06-10 DIAGNOSIS — E669 Obesity, unspecified: Secondary | ICD-10-CM

## 2020-06-10 DIAGNOSIS — E785 Hyperlipidemia, unspecified: Secondary | ICD-10-CM

## 2020-06-10 DIAGNOSIS — R7303 Prediabetes: Secondary | ICD-10-CM

## 2020-06-10 DIAGNOSIS — F17200 Nicotine dependence, unspecified, uncomplicated: Secondary | ICD-10-CM

## 2020-06-10 MED ORDER — ALBUTEROL SULFATE HFA 108 (90 BASE) MCG/ACT IN AERS: 1.0000 | INHALATION_SPRAY | Freq: Four times a day (QID) | RESPIRATORY_TRACT | 1 refills | Status: DC | PRN

## 2020-06-10 MED ORDER — LOSARTAN POTASSIUM 100 MG PO TABS: 100.0000 mg | ORAL_TABLET | Freq: Every day | ORAL | 1 refills | Status: DC

## 2020-06-10 MED ORDER — CETIRIZINE HCL 10 MG PO TABS: 10.0000 mg | ORAL_TABLET | Freq: Every day | ORAL | 1 refills | Status: DC

## 2020-06-10 MED ORDER — ATORVASTATIN CALCIUM 20 MG PO TABS: 20.0000 mg | ORAL_TABLET | Freq: Every day | ORAL | 1 refills | Status: DC

## 2020-06-10 NOTE — Progress Notes (Unsigned)
BP (!) 148/83   Pulse 91   Temp 97.7 F (36.5 C)   SpO2 94%    Subjective:    Patient ID: Monique Harmon, female    DOB: 10-24-1960, 60 y.o.   MRN: [id number]  HPI: Monique Harmon is a 60 y.o. female presenting on 06/10/2020 for No chief complaint on file.   HPI    Pt had a negative covid 19 screening questionnaire.    Pt is 59yoF with follow up for HTN and dyslipidemia.  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  She has been out of her atorvastatin for some time.   She is still smoking some.  She has cut back.  She says she is Doing good.  She has no complaints.       Relevant past medical, surgical, family and social history reviewed and updated as indicated. Interim medical history since our last visit reviewed. Allergies and medications reviewed and updated.   Current Outpatient Medications:  .  albuterol (VENTOLIN HFA) 108 (90 Base) MCG/ACT inhaler, Inhale 1-2 puffs into the lungs every 6 (six) hours as needed for wheezing or shortness of breath., Disp: 18 g, Rfl: 0 .  cetirizine (ZYRTEC) 10 MG tablet, Take 1 tablet (10 mg total) by mouth daily., Disp: 90 tablet, Rfl: 1 .  losartan (COZAAR) 50 MG tablet, Take 1 tablet (50 mg total) by mouth daily., Disp: 90 tablet, Rfl: 1 .  atorvastatin (LIPITOR) 20 MG tablet, Take 1 tablet (20 mg total) by mouth daily. (Patient not taking: Reported on 06/10/2020), Disp: 90 tablet, Rfl: 1    Review of Systems  Per HPI unless specifically indicated above     Objective:    BP (!) 148/83   Pulse 91   Temp 97.7 F (36.5 C)   SpO2 94%   Wt Readings from Last 3 Encounters:  03/19/20 199 lb 11.2 oz (90.6 kg)  12/19/19 205 lb 3.2 oz (93.1 kg)  09/19/19 204 lb 4.8 oz (92.7 kg)    Physical Exam Vitals reviewed.  Constitutional:      General: She is not in acute distress.    Appearance: She is well-developed and well-nourished. She is not ill-appearing.  HENT:     Head: Normocephalic and  atraumatic.  Cardiovascular:     Rate and Rhythm: Normal rate and regular rhythm.  Pulmonary:     Effort: Pulmonary effort is normal.     Breath sounds: Normal breath sounds.  Abdominal:     General: Bowel sounds are normal.     Palpations: Abdomen is soft. There is no hepatosplenomegaly or mass.     Tenderness: There is no abdominal tenderness.  Musculoskeletal:        General: No edema.     Cervical back: Neck supple.     Right lower leg: No edema.     Left lower leg: No edema.  Lymphadenopathy:     Cervical: No cervical adenopathy.  Skin:    General: Skin is warm and dry.  Neurological:     Mental Status: She is alert and oriented to person, place, and time.  Psychiatric:        Mood and Affect: Mood and affect normal.        Behavior: Behavior normal.     Results for orders placed or performed during the hospital encounter of 05/28/20  Lipid panel  Result Value Ref Range   Cholesterol 214 (H) 0 - 200 mg/dL  Triglycerides 145 <150 mg/dL   HDL 61 >81 mg/dL   Total CHOL/HDL Ratio 3.5 RATIO   VLDL 29 0 - 40 mg/dL   LDL Cholesterol 157 (H) 0 - 99 mg/dL  Comprehensive metabolic panel  Result Value Ref Range   Sodium 137 135 - 145 mmol/L   Potassium 5.1 3.5 - 5.1 mmol/L   Chloride 98 98 - 111 mmol/L   CO2 30 22 - 32 mmol/L   Glucose, Bld 114 (H) 70 - 99 mg/dL   BUN 10 6 - 20 mg/dL   Creatinine, Ser 2.62 0.44 - 1.00 mg/dL   Calcium 9.1 8.9 - 03.5 mg/dL   Total Protein 7.1 6.5 - 8.1 g/dL   Albumin 4.0 3.5 - 5.0 g/dL   AST 21 15 - 41 U/L   ALT 21 0 - 44 U/L   Alkaline Phosphatase 64 38 - 126 U/L   Total Bilirubin 0.5 0.3 - 1.2 mg/dL   GFR, Estimated >59 >74 mL/min   Anion gap 9 5 - 15      Assessment & Plan:    Encounter Diagnoses  Name Primary?  . Essential hypertension Yes  . Hyperlipidemia, unspecified hyperlipidemia type   . Tobacco use disorder   . Prediabetes   . Obesity, unspecified classification, unspecified obesity type, unspecified whether  serious comorbidity present      -reviewed labs with pt -Increase losartan -pt counseled to Get back on atorvatatin and watch lowfat diet -discussed with pt to update PAP at local gyn for which she has insurance /  [facility] med[facility] -discussed mammograms and Pt wants to do mammogram every other year -pt to follow up  3 months for htn, dyslipidemia and prediabetes.  She is to contact office sooner prn

## 2020-07-01 ENCOUNTER — Other Ambulatory Visit: Payer: Self-pay | Admitting: Physician Assistant

## 2020-07-01 MED ORDER — IRBESARTAN 300 MG PO TABS: 300.0000 mg | ORAL_TABLET | Freq: Every day | ORAL | 3 refills | Status: DC

## 2020-07-30 ENCOUNTER — Encounter: Payer: Self-pay | Admitting: Physician Assistant

## 2020-07-30 ENCOUNTER — Ambulatory Visit: Payer: Medicaid Other | Admitting: Physician Assistant

## 2020-07-30 VITALS — BP 130/74 | HR 86 | Temp 97.9°F | Wt 203.0 lb

## 2020-07-30 DIAGNOSIS — I8390 Asymptomatic varicose veins of unspecified lower extremity: Secondary | ICD-10-CM

## 2020-07-30 NOTE — Progress Notes (Signed)
   BP 130/74   Pulse 86   Temp 97.9 F (36.6 C)   Wt 203 lb (92.1 kg)   SpO2 96%   BMI 34.84 kg/m    Subjective:    Patient ID: Monique Harmon, female    DOB: 1960/08/29, 60 y.o.   MRN: [id number]  HPI: Monique Harmon is a 60 y.o. female presenting on 07/30/2020 for Mass (On right leg)   HPI   Pt had a negative covid 19 screening questionnaire.   Pt presents for bump on RLE.  She says it has been there for a long time but she thinks it is more prominent now and she wanted to get it looked at.  It does not hurt or drain     Relevant past medical, surgical, family and social history reviewed and updated as indicated. Interim medical history since our last visit reviewed. Allergies and medications reviewed and updated.   Current Outpatient Medications:  .  albuterol (VENTOLIN HFA) 108 (90 Base) MCG/ACT inhaler, Inhale 1-2 puffs into the lungs every 6 (six) hours as needed for wheezing or shortness of breath., Disp: 18 g, Rfl: 1 .  atorvastatin (LIPITOR) 20 MG tablet, Take 1 tablet (20 mg total) by mouth daily., Disp: 90 tablet, Rfl: 1 .  cetirizine (ZYRTEC) 10 MG tablet, Take 1 tablet (10 mg total) by mouth daily., Disp: 90 tablet, Rfl: 1 .  irbesartan (AVAPRO) 300 MG tablet, Take 1 tablet (300 mg total) by mouth daily., Disp: 30 tablet, Rfl: 3 .  losartan (COZAAR) 100 MG tablet, Take 1 tablet (100 mg total) by mouth daily. (Patient not taking: Reported on 07/30/2020), Disp: 90 tablet, Rfl: 1    Review of Systems  Per HPI unless specifically indicated above     Objective:    BP 130/74   Pulse 86   Temp 97.9 F (36.6 C)   Wt 203 lb (92.1 kg)   SpO2 96%   BMI 34.84 kg/m   Wt Readings from Last 3 Encounters:  07/30/20 203 lb (92.1 kg)  03/19/20 199 lb 11.2 oz (90.6 kg)  12/19/19 205 lb 3.2 oz (93.1 kg)    Physical Exam Constitutional:      General: She is not in acute distress.    Appearance: She is not toxic-appearing.  HENT:     Head: Normocephalic and atraumatic.   Pulmonary:     Effort: Pulmonary effort is normal. No respiratory distress.  Musculoskeletal:     Right lower leg: No swelling, deformity or tenderness. No edema.     Comments: Pt has a small varicosity R posterior lower leg.  nontender.  Neurological:     Mental Status: She is alert and oriented to person, place, and time.  Psychiatric:        Attention and Perception: Attention normal.        Speech: Speech normal.        Behavior: Behavior is cooperative.          Assessment & Plan:    Encounter Diagnosis  Name Primary?  . Asymptomatic varicose veins Yes     Pt reassured She will follow up as scheduled

## 2020-08-27 ENCOUNTER — Other Ambulatory Visit: Payer: Self-pay | Admitting: Physician Assistant

## 2020-08-27 DIAGNOSIS — E785 Hyperlipidemia, unspecified: Secondary | ICD-10-CM

## 2020-08-27 DIAGNOSIS — I10 Essential (primary) hypertension: Secondary | ICD-10-CM

## 2020-08-27 DIAGNOSIS — R7303 Prediabetes: Secondary | ICD-10-CM

## 2020-09-05 IMAGING — MG DIGITAL SCREENING BILAT W/ TOMO W/ CAD
6 of 12 series · 6 of 36 positions shown · non-contrast
Comparison: None
COMPARISON: None

Addendum:
CLINICAL DATA: Screening.

EXAM:
DIGITAL SCREENING BILATERAL MAMMOGRAM WITH TOMO AND CAD

[R MLO synth-2D (1 of 2)]
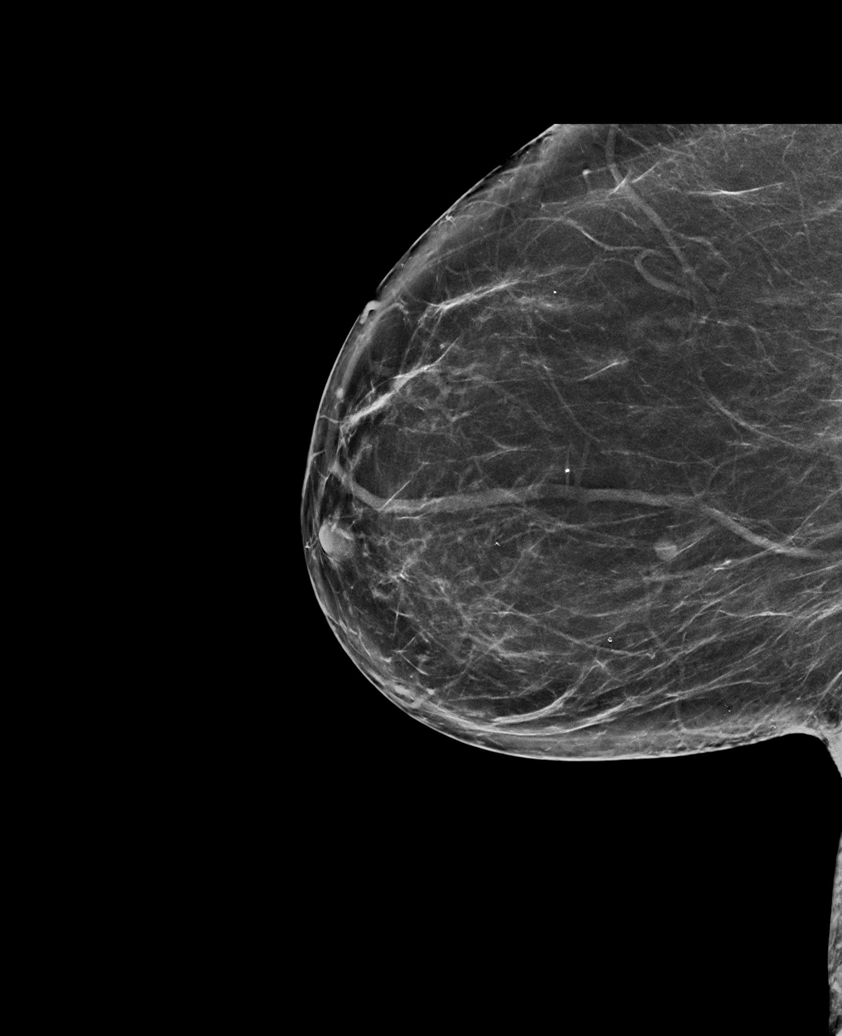

[L MLO synth-2D (1 of 2)]
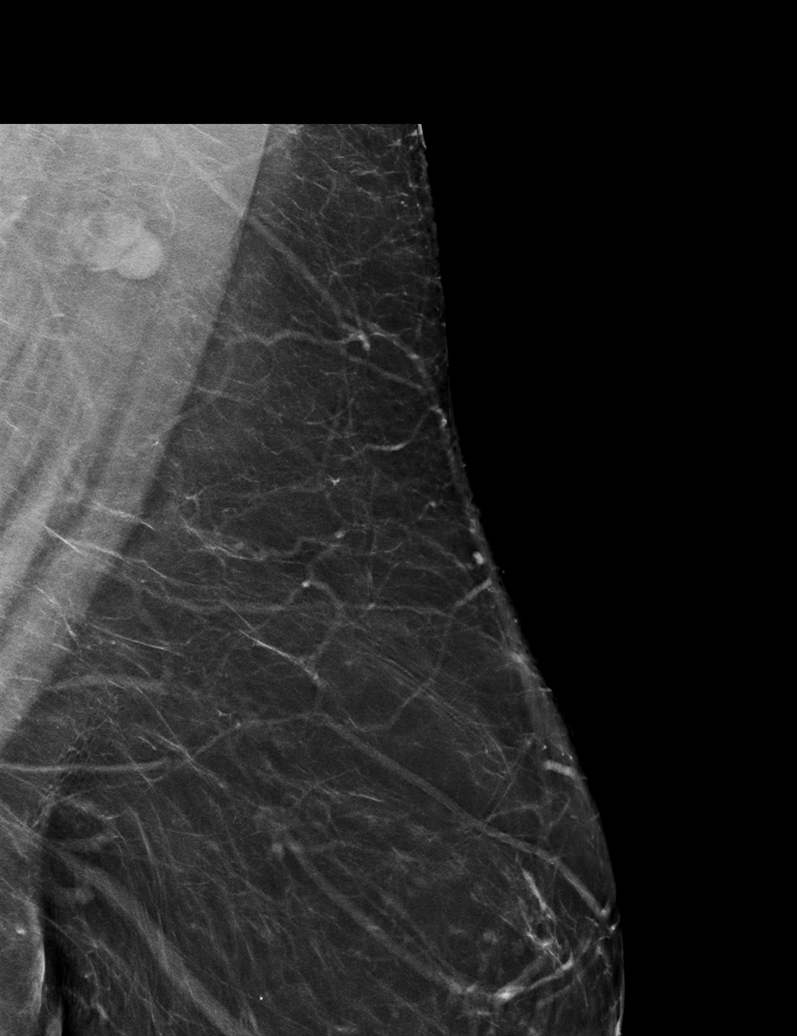

[R MLO synth-2D (2 of 2)]
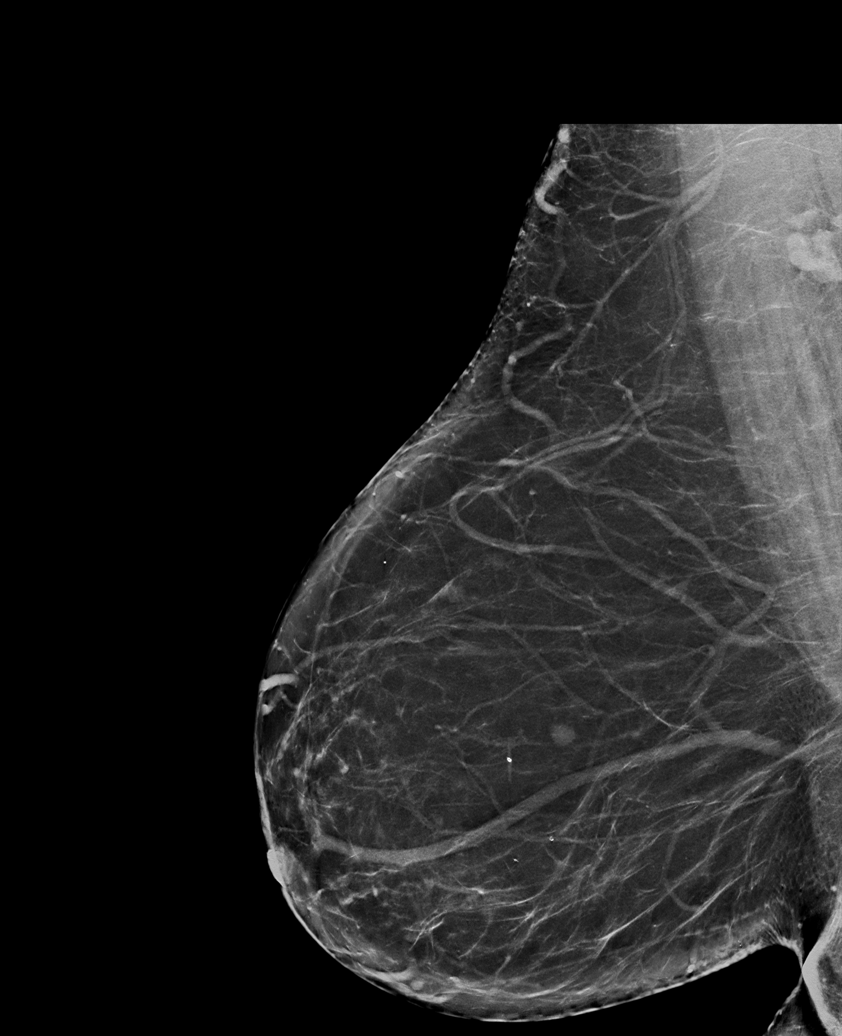

[L MLO synth-2D (2 of 2)]
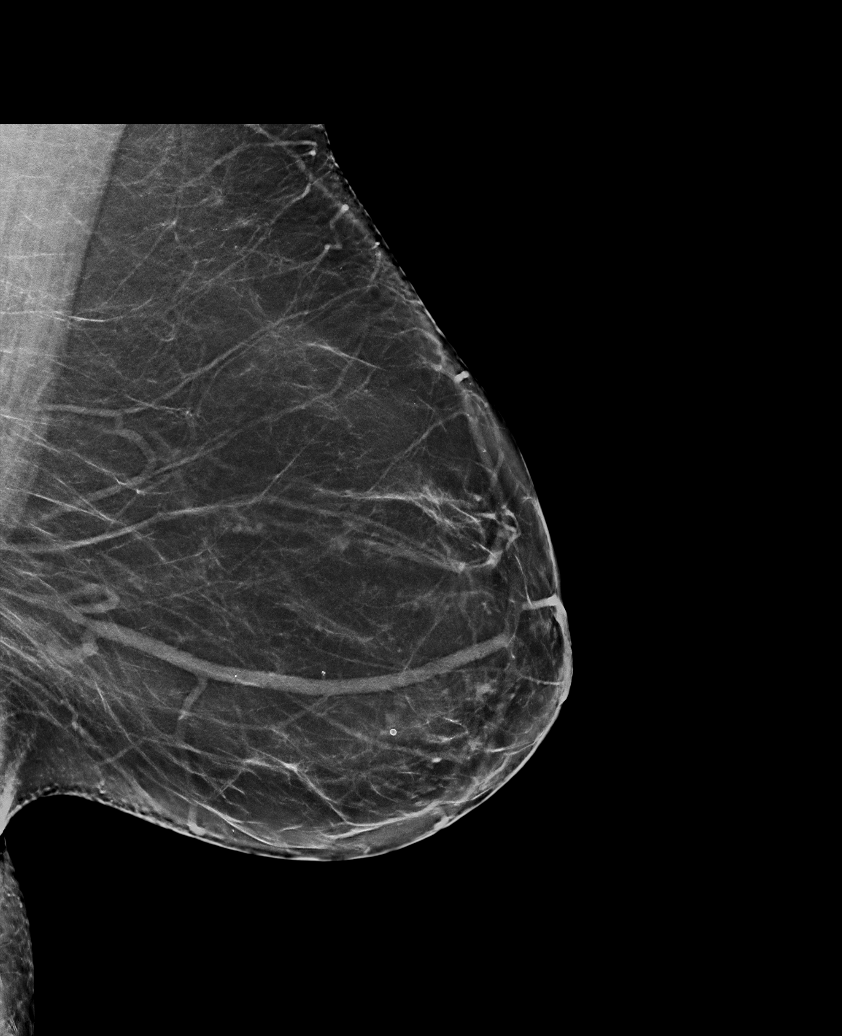

[L CC synth-2D]
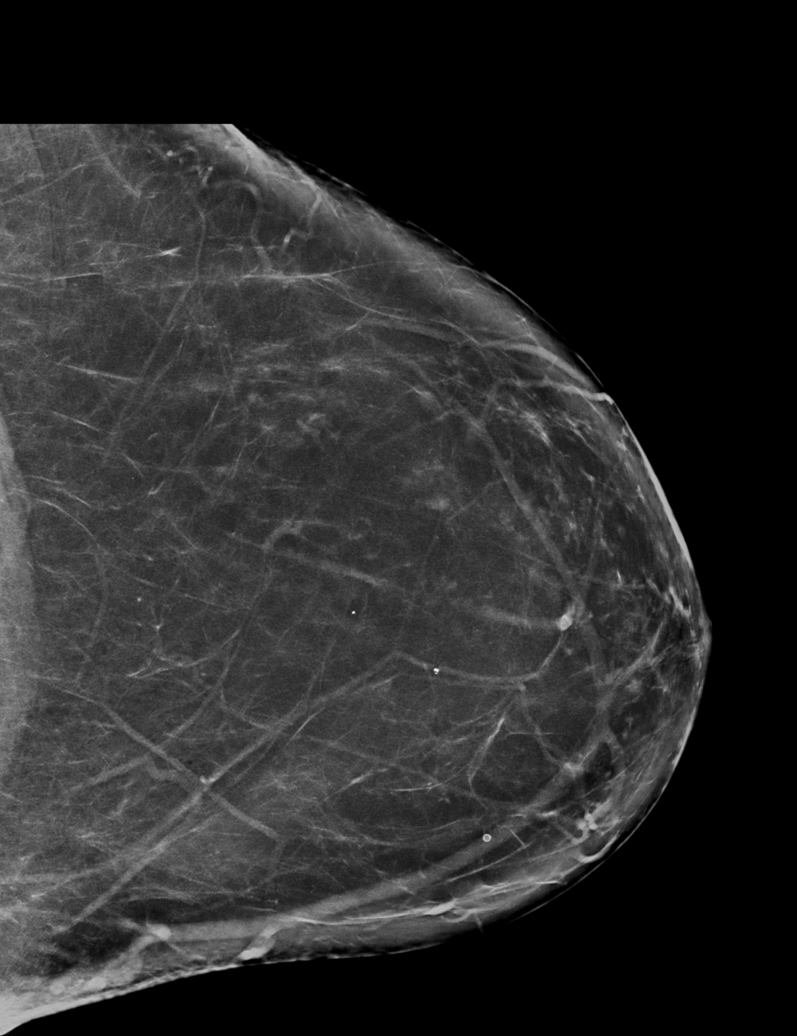

[R CC synth-2D]
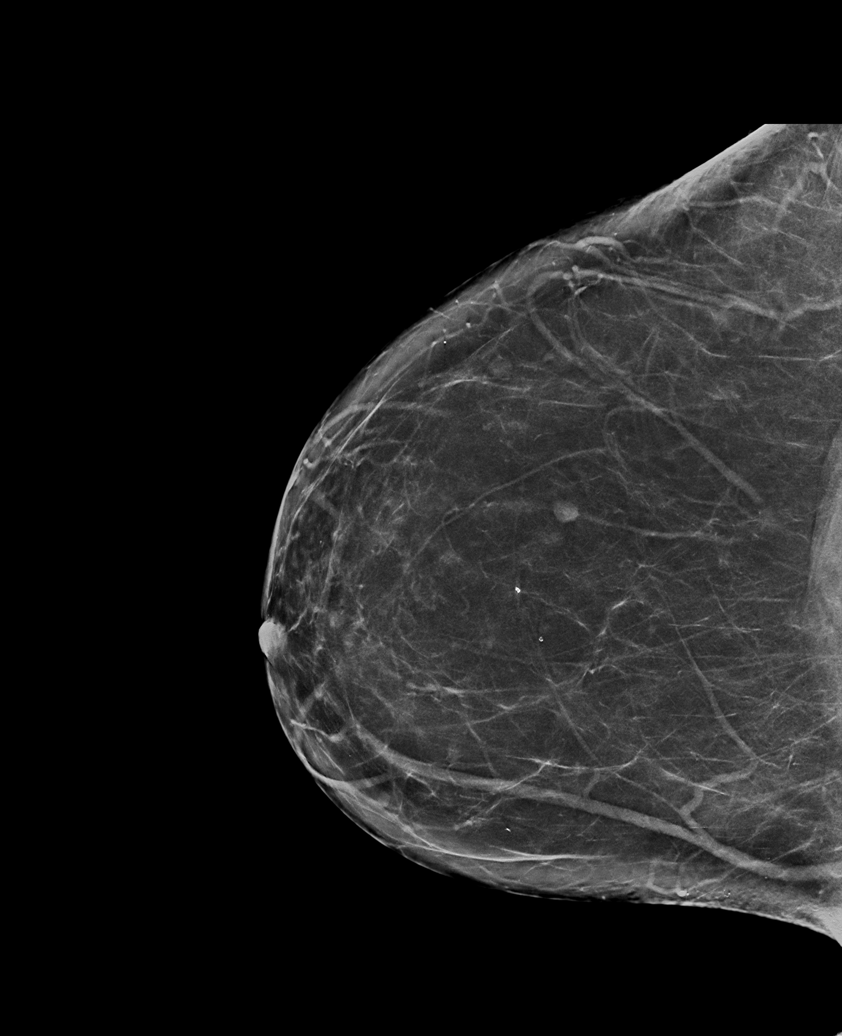

[6 of 36 positions shown; findings below may reference images not displayed]

ACR Breast Density Category b: There are scattered areas of
fibroglandular density.
FINDINGS: In the right breast, a possible mass warrants further evaluation. In
the left breast, no findings suspicious for malignancy. Images were
processed with CAD.
IMPRESSION: Further evaluation is suggested for possible mass in the right
breast.

RECOMMENDATION:
Diagnostic mammogram and possibly ultrasound of the right breast.
(Code:YV-O-33X)

The patient will be contacted regarding the findings, and additional
imaging will be scheduled.

BI-RADS CATEGORY  0: Incomplete. Need additional imaging evaluation
and/or prior mammograms for comparison.

ADDENDUM:
Comparison is made with previous exams performed at [REDACTED] on
03/18/2010 and 11/10/2011. No further evaluation is recommended for
the RIGHT breast.

Screening mammogram in one year.(Code:E1-H-PVC)

BI-RADS 1: Negative.

*** End of Addendum ***
ACR Breast Density Category b: There are scattered areas of
fibroglandular density.
FINDINGS: In the right breast, a possible mass warrants further evaluation. In
the left breast, no findings suspicious for malignancy. Images were
processed with CAD.
IMPRESSION: Further evaluation is suggested for possible mass in the right
breast.

RECOMMENDATION:
Diagnostic mammogram and possibly ultrasound of the right breast.
(Code:YV-O-33X)

The patient will be contacted regarding the findings, and additional
imaging will be scheduled.

BI-RADS CATEGORY  0: Incomplete. Need additional imaging evaluation
and/or prior mammograms for comparison.

## 2020-09-08 ENCOUNTER — Ambulatory Visit: Payer: Medicaid Other | Admitting: Physician Assistant

## 2020-09-09 ENCOUNTER — Other Ambulatory Visit: Payer: Self-pay

## 2020-09-09 ENCOUNTER — Other Ambulatory Visit (HOSPITAL_COMMUNITY)
Admission: RE | Admit: 2020-09-09 | Discharge: 2020-09-09 | Disposition: A | Discharge status: Home / Self Care | Payer: Medicaid Other | Source: Ambulatory Visit | Attending: Physician Assistant | Admitting: Physician Assistant

## 2020-09-09 DIAGNOSIS — I10 Essential (primary) hypertension: Secondary | ICD-10-CM | POA: Insufficient documentation

## 2020-09-09 DIAGNOSIS — E785 Hyperlipidemia, unspecified: Secondary | ICD-10-CM | POA: Insufficient documentation

## 2020-09-09 DIAGNOSIS — R7303 Prediabetes: Secondary | ICD-10-CM | POA: Insufficient documentation

## 2020-09-09 LAB — COMPREHENSIVE METABOLIC PANEL
ALT: 23 U/L (ref 0–44)
AST: 22 U/L (ref 15–41)
Albumin: 4.1 g/dL (ref 3.5–5.0)
Alkaline Phosphatase: 64 U/L (ref 38–126)
Anion gap: 11 (ref 5–15)
BUN: 11 mg/dL (ref 6–20)
CO2: 27 mmol/L (ref 22–32)
Calcium: 9.2 mg/dL (ref 8.9–10.3)
Chloride: 101 mmol/L (ref 98–111)
Creatinine, Ser: 0.59 mg/dL (ref 0.44–1.00)
GFR, Estimated: >60 mL/min (ref >60)
Glucose, Bld: 113 mg/dL — ABNORMAL HIGH (ref 70–99)
Potassium: 4.7 mmol/L (ref 3.5–5.1)
Sodium: 139 mmol/L (ref 135–145)
Total Bilirubin: 0.8 mg/dL (ref 0.3–1.2)
Total Protein: 7.3 g/dL (ref 6.5–8.1)

## 2020-09-09 LAB — LIPID PANEL
Cholesterol: 174 mg/dL (ref 0–200)
HDL: 66 mg/dL (ref >40)
LDL Cholesterol: 74 mg/dL (ref 0–99)
Total CHOL/HDL Ratio: 2.6 RATIO
Triglycerides: 168 mg/dL — ABNORMAL HIGH (ref <150)
VLDL: 34 mg/dL (ref 0–40)

## 2020-09-09 LAB — HEMOGLOBIN A1C
Hgb A1c MFr Bld: 5.8 % — ABNORMAL HIGH (ref 4.8–5.6)
Mean Plasma Glucose: 119.76 mg/dL

## 2020-09-10 ENCOUNTER — Ambulatory Visit: Payer: Medicaid Other | Admitting: Physician Assistant

## 2020-09-10 ENCOUNTER — Encounter: Payer: Self-pay | Admitting: Physician Assistant

## 2020-09-10 ENCOUNTER — Other Ambulatory Visit: Payer: Self-pay

## 2020-09-10 VITALS — BP 138/84 | HR 100 | Temp 97.8°F | Wt 202.0 lb

## 2020-09-10 DIAGNOSIS — I10 Essential (primary) hypertension: Secondary | ICD-10-CM

## 2020-09-10 DIAGNOSIS — E785 Hyperlipidemia, unspecified: Secondary | ICD-10-CM

## 2020-09-10 DIAGNOSIS — F17200 Nicotine dependence, unspecified, uncomplicated: Secondary | ICD-10-CM

## 2020-09-10 MED ORDER — ATORVASTATIN CALCIUM 20 MG PO TABS: 20.0000 mg | ORAL_TABLET | Freq: Every day | ORAL | 1 refills | Status: DC

## 2020-09-10 MED ORDER — LOSARTAN POTASSIUM 100 MG PO TABS: 100.0000 mg | ORAL_TABLET | Freq: Every day | ORAL | 1 refills | Status: DC

## 2020-09-10 MED ORDER — ALBUTEROL SULFATE HFA 108 (90 BASE) MCG/ACT IN AERS: 1.0000 | INHALATION_SPRAY | Freq: Four times a day (QID) | RESPIRATORY_TRACT | 0 refills | Status: DC | PRN

## 2020-09-10 NOTE — Progress Notes (Signed)
BP 138/84   Pulse 100   Temp 97.8 F (36.6 C)   Wt 202 lb (91.6 kg)   SpO2 91%   BMI 34.67 kg/m    Subjective:    Patient ID: Monique Harmon, female    DOB: [birth year], 60 y.o.   MRN: [id number]  HPI: Monique Harmon is a 60 y.o. female presenting on 09/10/2020 for Hypertension and Hyperlipidemia   HPI   Pt had a negative covid 19 screening questionnaire.   Chief Complaint  Patient presents with  . Hypertension  . Hyperlipidemia      Pt is Still smoking.  She says she is Doing well.  No complaints except ocassional diarrhea.  She thinks it is something she eats that gives her the diarrhea but she doesn't know what it is.        Relevant past medical, surgical, family and social history reviewed and updated as indicated. Interim medical history since our last visit reviewed. Allergies and medications reviewed and updated.    Current Outpatient Medications:  .  albuterol (VENTOLIN HFA) 108 (90 Base) MCG/ACT inhaler, Inhale 1-2 puffs into the lungs every 6 (six) hours as needed for wheezing or shortness of breath., Disp: 18 g, Rfl: 1 .  atorvastatin (LIPITOR) 20 MG tablet, Take 1 tablet (20 mg total) by mouth daily., Disp: 90 tablet, Rfl: 1 .  cetirizine (ZYRTEC) 10 MG tablet, Take 1 tablet (10 mg total) by mouth daily., Disp: 90 tablet, Rfl: 1 .  losartan (COZAAR) 100 MG tablet, Take 1 tablet (100 mg total) by mouth daily., Disp: 90 tablet, Rfl: 1    Review of Systems  Per HPI unless specifically indicated above     Objective:    BP 138/84   Pulse 100   Temp 97.8 F (36.6 C)   Wt 202 lb (91.6 kg)   SpO2 91%   BMI 34.67 kg/m   Wt Readings from Last 3 Encounters:  09/10/20 202 lb (91.6 kg)  07/30/20 203 lb (92.1 kg)  03/19/20 199 lb 11.2 oz (90.6 kg)    Physical Exam Vitals reviewed.  Constitutional:      General: She is not in acute distress.    Appearance: She is well-developed. She is obese. She is not ill-appearing.  HENT:     Head:  Normocephalic and atraumatic.  Cardiovascular:     Rate and Rhythm: Normal rate and regular rhythm.  Pulmonary:     Effort: Pulmonary effort is normal.     Breath sounds: Normal breath sounds.  Abdominal:     General: Bowel sounds are normal.     Palpations: Abdomen is soft. There is no mass.     Tenderness: There is no abdominal tenderness.  Musculoskeletal:     Cervical back: Neck supple.     Right lower leg: No edema.     Left lower leg: No edema.  Lymphadenopathy:     Cervical: No cervical adenopathy.  Skin:    General: Skin is warm and dry.  Neurological:     Mental Status: She is alert and oriented to person, place, and time.  Psychiatric:        Attention and Perception: Attention normal.        Mood and Affect: Mood normal.        Speech: Speech normal.        Behavior: Behavior normal. Behavior is cooperative.     Results for orders placed or performed during the hospital encounter of 09/09/20  Comprehensive  metabolic panel  Result Value Ref Range   Sodium 139 135 - 145 mmol/L   Potassium 4.7 3.5 - 5.1 mmol/L   Chloride 101 98 - 111 mmol/L   CO2 27 22 - 32 mmol/L   Glucose, Bld 113 (H) 70 - 99 mg/dL   BUN 11 6 - 20 mg/dL   Creatinine, Ser 3.55 0.44 - 1.00 mg/dL   Calcium 9.2 8.9 - 73.2 mg/dL   Total Protein 7.3 6.5 - 8.1 g/dL   Albumin 4.1 3.5 - 5.0 g/dL   AST 22 15 - 41 U/L   ALT 23 0 - 44 U/L   Alkaline Phosphatase 64 38 - 126 U/L   Total Bilirubin 0.8 0.3 - 1.2 mg/dL   GFR, Estimated >20 >25 mL/min   Anion gap 11 5 - 15  Hemoglobin A1c  Result Value Ref Range   Hgb A1c MFr Bld 5.8 (H) 4.8 - 5.6 %   Mean Plasma Glucose 119.76 mg/dL  Lipid panel  Result Value Ref Range   Cholesterol 174 0 - 200 mg/dL   Triglycerides 427 (H) <150 mg/dL   HDL 66 >06 mg/dL   Total CHOL/HDL Ratio 2.6 RATIO   VLDL 34 0 - 40 mg/dL   LDL Cholesterol 74 0 - 99 mg/dL      Assessment & Plan:    Encounter Diagnoses  Name Primary?  . Essential hypertension Yes  .  Hyperlipidemia, unspecified hyperlipidemia type   . Tobacco use disorder      -reviewed labs with pt -Pt would like to do screening mammogram every other year.  Will plan to do it April 2023 -She needs to call gyn for pap.  She has family planning medicaid which will pay for PAP -pt is encouraged to keep a food diary to help her find out what foods give her diarrhea -no medication changes -pt to follow up 3 months.  She is to contact office sooner prn

## 2020-12-03 ENCOUNTER — Other Ambulatory Visit: Payer: Self-pay | Admitting: Physician Assistant

## 2020-12-03 DIAGNOSIS — E785 Hyperlipidemia, unspecified: Secondary | ICD-10-CM

## 2020-12-03 DIAGNOSIS — I10 Essential (primary) hypertension: Secondary | ICD-10-CM

## 2020-12-15 ENCOUNTER — Other Ambulatory Visit: Payer: Self-pay | Admitting: Physician Assistant

## 2020-12-15 ENCOUNTER — Ambulatory Visit: Payer: Medicaid Other | Admitting: Physician Assistant

## 2020-12-15 ENCOUNTER — Encounter: Payer: Self-pay | Admitting: Physician Assistant

## 2020-12-15 VITALS — BP 139/80 | HR 104 | Temp 97.2°F | Wt 204.0 lb

## 2020-12-15 DIAGNOSIS — E785 Hyperlipidemia, unspecified: Secondary | ICD-10-CM

## 2020-12-15 DIAGNOSIS — Z5320 Procedure and treatment not carried out because of patient's decision for unspecified reasons: Secondary | ICD-10-CM

## 2020-12-15 DIAGNOSIS — F17200 Nicotine dependence, unspecified, uncomplicated: Secondary | ICD-10-CM

## 2020-12-15 DIAGNOSIS — I10 Essential (primary) hypertension: Secondary | ICD-10-CM

## 2020-12-15 DIAGNOSIS — R000 Tachycardia, unspecified: Secondary | ICD-10-CM

## 2020-12-15 MED ORDER — ATORVASTATIN CALCIUM 20 MG PO TABS: 20.0000 mg | ORAL_TABLET | Freq: Every day | ORAL | 0 refills | Status: DC

## 2020-12-15 MED ORDER — ALBUTEROL SULFATE HFA 108 (90 BASE) MCG/ACT IN AERS: 1.0000 | INHALATION_SPRAY | Freq: Four times a day (QID) | RESPIRATORY_TRACT | 0 refills | Status: DC | PRN

## 2020-12-15 MED ORDER — CETIRIZINE HCL 10 MG PO TABS: 10.0000 mg | ORAL_TABLET | Freq: Every day | ORAL | 0 refills | Status: AC

## 2020-12-15 MED ORDER — METOPROLOL TARTRATE 50 MG PO TABS: 50.0000 mg | ORAL_TABLET | Freq: Two times a day (BID) | ORAL | 0 refills | Status: DC

## 2020-12-15 MED ORDER — LOSARTAN POTASSIUM 100 MG PO TABS: 100.0000 mg | ORAL_TABLET | Freq: Every day | ORAL | 0 refills | Status: DC

## 2020-12-15 NOTE — Progress Notes (Signed)
BP 139/80   Pulse (!) 104   Temp (!) 97.2 F (36.2 C)   Wt 204 lb (92.5 kg)   SpO2 91%   BMI 35.02 kg/m    Subjective:    Patient ID: Monique Harmon, female    DOB: 1960-07-01, 60 y.o.   MRN: [id number]  HPI: Monique Harmon is a 60 y.o. female presenting on 12/15/2020 for Hypertension and Hyperlipidemia   HPI   Pt had a negative covid 19 screening questionnaire.  Chief Complaint  Patient presents with   Hypertension   Hyperlipidemia      Pt is currently Working as a Programmer, multimedia.  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   Pt didn't get labs because she got a partial bill for her last labs  She says she is doing okay but sometimes feels like her heart is beating fast.  She does not have any CP or SOB.     Relevant past medical, surgical, family and social history reviewed and updated as indicated. Interim medical history since our last visit reviewed. Allergies and medications reviewed and updated.   Current Outpatient Medications:    atorvastatin (LIPITOR) 20 MG tablet, Take 1 tablet (20 mg total) by mouth daily., Disp: 90 tablet, Rfl: 1   cetirizine (ZYRTEC) 10 MG tablet, Take 1 tablet (10 mg total) by mouth daily., Disp: 90 tablet, Rfl: 1   losartan (COZAAR) 100 MG tablet, Take 1 tablet (100 mg total) by mouth daily., Disp: 90 tablet, Rfl: 1   albuterol (VENTOLIN HFA) 108 (90 Base) MCG/ACT inhaler, Inhale 1-2 puffs into the lungs every 6 (six) hours as needed for wheezing or shortness of breath. (Patient not taking: Reported on 12/15/2020), Disp: 3 each, Rfl: 0   Review of Systems  Per HPI unless specifically indicated above     Objective:    BP 139/80   Pulse (!) 104   Temp (!) 97.2 F (36.2 C)   Wt 204 lb (92.5 kg)   SpO2 91%   BMI 35.02 kg/m   Wt Readings from Last 3 Encounters:  12/15/20 204 lb (92.5 kg)  09/10/20 202 lb (91.6 kg)   07/30/20 203 lb (92.1 kg)    Physical Exam Vitals reviewed.  Constitutional:      General: She is not in acute distress.    Appearance: She is well-developed. She is not ill-appearing.  HENT:     Head: Normocephalic and atraumatic.  Cardiovascular:     Rate and Rhythm: Normal rate and regular rhythm.  Pulmonary:     Effort: Pulmonary effort is normal.     Breath sounds: Normal breath sounds.  Abdominal:     General: Bowel sounds are normal.     Palpations: Abdomen is soft. There is no mass.     Tenderness: There is no abdominal tenderness.  Musculoskeletal:     Cervical back: Neck supple.     Right lower leg: No edema.     Left lower leg: No edema.  Lymphadenopathy:     Cervical: No cervical adenopathy.  Skin:    General: Skin is warm and dry.  Neurological:     Mental Status: She is alert and oriented to person, place, and time.  Psychiatric:        Behavior: Behavior normal.     EKG:   sinus tachycardia at 103bpm.  No  acute st-t changes.  No previous for comparison     Assessment & Plan:   Encounter Diagnoses  Name Primary?   Primary hypertension Yes   Hyperlipidemia, unspecified hyperlipidemia type    Tachycardia    Tobacco use disorder    Screening mammography declined        -pt declined screening mammogram -Consider tsh with next labs -will add Low dose beta blocker to help with her bp and pulse.  Pt is in agreement with plan -pt to continue her losartan and atorvastatin -she is encouraged to monitor her bp at home (she has a monitor) -pt to follow up 3 months.  She is to contact office sooner prn

## 2021-02-03 ENCOUNTER — Ambulatory Visit: Payer: Medicaid Other | Admitting: Physician Assistant

## 2021-02-03 ENCOUNTER — Encounter: Payer: Self-pay | Admitting: Physician Assistant

## 2021-02-03 ENCOUNTER — Other Ambulatory Visit: Payer: Self-pay

## 2021-02-03 VITALS — BP 142/83 | HR 93 | Temp 97.7°F | Wt 204.5 lb

## 2021-02-03 DIAGNOSIS — F17200 Nicotine dependence, unspecified, uncomplicated: Secondary | ICD-10-CM

## 2021-02-03 DIAGNOSIS — Z0289 Encounter for other administrative examinations: Secondary | ICD-10-CM

## 2021-02-03 DIAGNOSIS — I10 Essential (primary) hypertension: Secondary | ICD-10-CM

## 2021-02-03 NOTE — Progress Notes (Signed)
BP (!) 142/83   Pulse 93   Temp 97.7 F (36.5 C)   Wt 204 lb 8 oz (92.8 kg)   SpO2 93%   BMI 35.10 kg/m    Subjective:    Patient ID: Monique Harmon, female    DOB: 1960-11-11, 60 y.o.   MRN: [id number]  HPI: Monique Harmon is a 60 y.o. female presenting on 02/03/2021 for Employment Physical   HPI  Pt had a negative covid 19 screening questionnaire.      Chief Complaint  Patient presents with   Employment Physical    Pt has brought no vaccination records except covid and PPD  Pt isn't taking her metoprolol.    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    Relevant past medical, surgical, family and social history reviewed and updated as indicated. Interim medical history since our last visit reviewed. Allergies and medications reviewed and updated.   Current Outpatient Medications:    atorvastatin (LIPITOR) 20 MG tablet, Take 1 tablet (20 mg total) by mouth daily., Disp: 90 tablet, Rfl: 0   cetirizine (ZYRTEC) 10 MG tablet, Take 1 tablet (10 mg total) by mouth daily., Disp: 90 tablet, Rfl: 0   losartan (COZAAR) 100 MG tablet, Take 1 tablet (100 mg total) by mouth daily., Disp: 90 tablet, Rfl: 0   albuterol (VENTOLIN HFA) 108 (90 Base) MCG/ACT inhaler, Inhale 1-2 puffs into the lungs every 6 (six) hours as needed for wheezing or shortness of breath. (Patient not taking: Reported on 02/03/2021), Disp: 3 each, Rfl: 0   metoprolol tartrate (LOPRESSOR) 50 MG tablet, Take 1 tablet (50 mg total) by mouth 2 (two) times daily. (Patient not taking: Reported on 02/03/2021), Disp: 180 tablet, Rfl: 0    Review of Systems  Per HPI unless specifically indicated above     Objective:    BP (!) 142/83   Pulse 93   Temp 97.7 F (36.5 C)   Wt 204 lb 8 oz (92.8 kg)   SpO2 93%   BMI 35.10 kg/m   Wt Readings from Last 3 Encounters:  02/03/21 204 lb 8 oz (92.8 kg)  12/15/20 204 lb (92.5 kg)  09/10/20 202 lb (91.6 kg)     Vision Screening    Right eye Left eye Both eyes  Without correction 20/15 20/30 20/20   With correction 20/15 20/20 20/20       Physical Exam Vitals reviewed.  Constitutional:      General: She is not in acute distress.    Appearance: She is well-developed. She is not toxic-appearing.  HENT:     Head: Normocephalic and atraumatic.  Cardiovascular:     Rate and Rhythm: Normal rate and regular rhythm.  Pulmonary:     Effort: Pulmonary effort is normal.     Breath sounds: Normal breath sounds.  Abdominal:     General: Bowel sounds are normal.     Palpations: Abdomen is soft. There is no mass.     Tenderness: There is no abdominal tenderness.  Musculoskeletal:     Right shoulder: Normal range of motion.     Left shoulder: Normal range of motion.     Right elbow: Normal range of motion.     Left elbow: Normal range of motion.     Cervical back: Neck supple. Normal range of motion.     Thoracic back: Normal range of motion.     Lumbar back: Normal range of motion.  Right lower leg: No edema.     Left lower leg: No edema.     Comments: Pt is unable to sqat  Lymphadenopathy:     Cervical: No cervical adenopathy.  Skin:    General: Skin is warm and dry.  Neurological:     Mental Status: She is alert and oriented to person, place, and time.     Motor: No weakness or tremor.     Gait: Gait is intact.     Deep Tendon Reflexes:     Reflex Scores:      Patellar reflexes are 2+ on the right side and 2+ on the left side. Psychiatric:        Behavior: Behavior normal.          Assessment & Plan:    Encounter Diagnoses  Name Primary?   Encounter for physical examination related to employment Yes   Primary hypertension    Tobacco use disorder      -Form completed and returned to pt -pt encouraged to taker her metoprolol and monitor her bp -pt to follow up next month as scheduled.  She is to notify office if she gets insurance

## 2021-03-05 ENCOUNTER — Encounter: Payer: Self-pay | Admitting: Physician Assistant

## 2021-03-05 ENCOUNTER — Ambulatory Visit: Payer: Medicaid Other | Admitting: Physician Assistant

## 2021-03-05 DIAGNOSIS — J069 Acute upper respiratory infection, unspecified: Secondary | ICD-10-CM

## 2021-03-05 DIAGNOSIS — Z20822 Contact with and (suspected) exposure to covid-19: Secondary | ICD-10-CM

## 2021-03-05 NOTE — Progress Notes (Signed)
   There were no vitals taken for this visit.   Subjective:    Patient ID: Monique Harmon, female    DOB: August 02, 1960, 60 y.o.   MRN: [id number]  HPI: Monique Harmon is a 60 y.o. female presenting on 03/05/2021 for No chief complaint on file.   HPI  This is a telemedicine appointment through [employer].  I connected with  [name] on 03/05/21 by a video enabled telemedicine application and verified that I am speaking with the correct person using two identifiers.   I discussed the limitations of evaluation and management by telemedicine. The patient expressed understanding and agreed to proceed.  Pt is at home.  Provider is at office.   She works substitute teache- now mostly [employer]  She is sick.  She started feeling bad Tuesday night.  He was really really tired.  She felt achey and chilled.  Temp 101.  Using nyquil.  Temp today down to 99.  She did a covid test that was negative.    No problms with her breathing.    She has no appetite.    Relevant past medical, surgical, family and social history reviewed and updated as indicated. Interim medical history since our last visit reviewed. Allergies and medications reviewed and updated.   Current Outpatient Medications:    albuterol (VENTOLIN HFA) 108 (90 Base) MCG/ACT inhaler, Inhale 1-2 puffs into the lungs every 6 (six) hours as needed for wheezing or shortness of breath., Disp: 3 each, Rfl: 0   atorvastatin (LIPITOR) 20 MG tablet, Take 1 tablet (20 mg total) by mouth daily., Disp: 90 tablet, Rfl: 0   cetirizine (ZYRTEC) 10 MG tablet, Take 1 tablet (10 mg total) by mouth daily., Disp: 90 tablet, Rfl: 0   losartan (COZAAR) 100 MG tablet, Take 1 tablet (100 mg total) by mouth daily., Disp: 90 tablet, Rfl: 0   metoprolol tartrate (LOPRESSOR) 50 MG tablet, Take 1 tablet (50 mg total) by mouth 2 (two) times daily., Disp: 180 tablet, Rfl: 0    Review of Systems  Per HPI unless specifically indicated above     Objective:     There were no vitals taken for this visit.  Wt Readings from Last 3 Encounters:  02/03/21 204 lb 8 oz (92.8 kg)  12/15/20 204 lb (92.5 kg)  09/10/20 202 lb (91.6 kg)    Physical Exam Constitutional:      General: She is not in acute distress.    Appearance: She is not toxic-appearing.  HENT:     Head: Normocephalic and atraumatic.  Pulmonary:     Effort: No respiratory distress.     Comments: Pt is talking in complete sentences without dyspnea Neurological:     Mental Status: She is alert and oriented to person, place, and time.  Psychiatric:        Behavior: Behavior normal.          Assessment & Plan:    Encounter Diagnoses  Name Primary?   Suspected COVID-19 virus infection Yes   Upper respiratory tract infection, unspecified type      -Recomended 2nd covid test -Counseled on self-isolation -her Daughter will pick up work note -OTCs prn for symptomatic control.  Rest, fluids, avoid smoking

## 2021-03-17 ENCOUNTER — Ambulatory Visit: Payer: Medicaid Other | Admitting: Physician Assistant

## 2021-03-30 ENCOUNTER — Other Ambulatory Visit: Payer: Self-pay | Admitting: Physician Assistant

## 2021-04-06 ENCOUNTER — Other Ambulatory Visit: Payer: Self-pay | Admitting: Physician Assistant

## 2021-04-06 ENCOUNTER — Telehealth: Payer: Self-pay

## 2021-04-06 MED ORDER — LOSARTAN POTASSIUM 100 MG PO TABS: 100.0000 mg | ORAL_TABLET | Freq: Every day | ORAL | 0 refills | Status: DC

## 2021-04-06 MED ORDER — ALBUTEROL SULFATE HFA 108 (90 BASE) MCG/ACT IN AERS: 1.0000 | INHALATION_SPRAY | Freq: Four times a day (QID) | RESPIRATORY_TRACT | 0 refills | Status: DC | PRN

## 2021-04-06 MED ORDER — METOPROLOL TARTRATE 50 MG PO TABS
50.0000 mg | ORAL_TABLET | Freq: Two times a day (BID) | ORAL | 0 refills | Status: DC
Start: 2021-04-06 — End: 2021-08-12

## 2021-04-06 MED ORDER — ATORVASTATIN CALCIUM 20 MG PO TABS: 20.0000 mg | ORAL_TABLET | Freq: Every day | ORAL | 0 refills | Status: DC

## 2021-04-06 NOTE — Telephone Encounter (Signed)
Call from pt regarding her medication no refilled, informed to call number on bottle and speak with pharmacy regarding refills & ensure that application is up to date.

## 2021-04-15 ENCOUNTER — Ambulatory Visit: Payer: Medicaid Other | Admitting: Physician Assistant

## 2021-04-15 ENCOUNTER — Encounter: Payer: Self-pay | Admitting: Physician Assistant

## 2021-04-15 ENCOUNTER — Other Ambulatory Visit: Payer: Self-pay

## 2021-04-15 VITALS — BP 160/60 | HR 109 | Temp 97.8°F | Wt 208.0 lb

## 2021-04-15 DIAGNOSIS — E785 Hyperlipidemia, unspecified: Secondary | ICD-10-CM

## 2021-04-15 DIAGNOSIS — I10 Essential (primary) hypertension: Secondary | ICD-10-CM

## 2021-04-15 DIAGNOSIS — F17200 Nicotine dependence, unspecified, uncomplicated: Secondary | ICD-10-CM

## 2021-04-15 NOTE — Progress Notes (Signed)
BP (!) 160/60   Pulse (!) 109   Temp 97.8 F (36.6 C)   Wt 208 lb (94.3 kg)   SpO2 (!) 89%   BMI 35.70 kg/m    Subjective:    Patient ID: Monique Harmon, female    DOB: 10/28/1960, 60 y.o.   MRN: [id number]  HPI: Monique Harmon is a 60 y.o. female presenting on 04/15/2021 for Hyperlipidemia and Hypertension   HPI  Chief Complaint  Patient presents with   Hyperlipidemia   Hypertension     She says she took the meltoprolol only 3 day. it made her feel like she was going to pass out.  She checked her bp at the time and says  bp 90 systolic.  She is still planning to get insurance but hasn't yet.  She says she is doing well but feels tired a lot.   She says she sleeps okay.   She does not exercise.  She continues to smoke.  She didn't get her labs drawn.      Relevant past medical, surgical, family and social history reviewed and updated as indicated. Interim medical history since our last visit reviewed. Allergies and medications reviewed and updated.   Current Outpatient Medications:    atorvastatin (LIPITOR) 20 MG tablet, Take 1 tablet (20 mg total) by mouth daily., Disp: 90 tablet, Rfl: 0   cetirizine (ZYRTEC) 10 MG tablet, Take 1 tablet (10 mg total) by mouth daily., Disp: 90 tablet, Rfl: 0   losartan (COZAAR) 100 MG tablet, Take 1 tablet (100 mg total) by mouth daily., Disp: 90 tablet, Rfl: 0   albuterol (VENTOLIN HFA) 108 (90 Base) MCG/ACT inhaler, Inhale 1-2 puffs into the lungs every 6 (six) hours as needed for wheezing or shortness of breath. (Patient not taking: Reported on 04/15/2021), Disp: 3 each, Rfl: 0   metoprolol tartrate (LOPRESSOR) 50 MG tablet, Take 1 tablet (50 mg total) by mouth 2 (two) times daily. (Patient not taking: Reported on 04/15/2021), Disp: 180 tablet, Rfl: 0     Review of Systems  Per HPI unless specifically indicated above     Objective:    BP (!) 160/60   Pulse (!) 109   Temp 97.8 F (36.6 C)   Wt 208 lb (94.3 kg)   SpO2  (!) 89%   BMI 35.70 kg/m   Wt Readings from Last 3 Encounters:  04/15/21 208 lb (94.3 kg)  02/03/21 204 lb 8 oz (92.8 kg)  12/15/20 204 lb (92.5 kg)    Physical Exam Vitals reviewed.  Constitutional:      Appearance: She is well-developed.  HENT:     Head: Normocephalic and atraumatic.  Cardiovascular:     Rate and Rhythm: Normal rate and regular rhythm.  Pulmonary:     Effort: Pulmonary effort is normal.     Breath sounds: Normal breath sounds.  Abdominal:     General: Bowel sounds are normal.     Palpations: Abdomen is soft. There is no mass.     Tenderness: There is no abdominal tenderness.  Musculoskeletal:     Cervical back: Neck supple.     Right lower leg: No edema.     Left lower leg: No edema.  Lymphadenopathy:     Cervical: No cervical adenopathy.  Skin:    General: Skin is warm and dry.  Neurological:     Mental Status: She is alert and oriented to person, place, and time.  Psychiatric:        Behavior:  Behavior normal.           Assessment & Plan:     Encounter Diagnoses  Name Primary?   Primary hypertension Yes   Hyperlipidemia, unspecified hyperlipidemia type    Tobacco use disorder      -pt to get labs drawn.   She will be called with results -she is encouraged to exercise regularly to help energy levels -encouraged smoking cessation -she Wants to wait on screening mammogram and perhaps get it every  2 years instead of annually -she will Take 1/2 metoprolol bid.  She is encouraged to monitor her bp -pt to follow up 3 months.  She will notify office if she gets insurance.  She is to contact office for any problems with bp or other issues

## 2021-07-06 ENCOUNTER — Other Ambulatory Visit: Payer: Self-pay | Admitting: Physician Assistant

## 2021-07-06 DIAGNOSIS — I10 Essential (primary) hypertension: Secondary | ICD-10-CM

## 2021-07-06 DIAGNOSIS — E785 Hyperlipidemia, unspecified: Secondary | ICD-10-CM

## 2021-07-15 ENCOUNTER — Ambulatory Visit: Payer: Medicaid Other | Admitting: Physician Assistant

## 2021-08-10 ENCOUNTER — Other Ambulatory Visit (HOSPITAL_COMMUNITY)
Admission: RE | Admit: 2021-08-10 | Discharge: 2021-08-10 | Disposition: A | Discharge status: Home / Self Care | Payer: Medicaid Other | Source: Ambulatory Visit | Attending: Physician Assistant | Admitting: Physician Assistant

## 2021-08-10 DIAGNOSIS — E785 Hyperlipidemia, unspecified: Secondary | ICD-10-CM | POA: Insufficient documentation

## 2021-08-10 DIAGNOSIS — I10 Essential (primary) hypertension: Secondary | ICD-10-CM | POA: Insufficient documentation

## 2021-08-10 LAB — LIPID PANEL
Cholesterol: 172 mg/dL (ref 0–200)
HDL: 63 mg/dL (ref >40)
LDL Cholesterol: 88 mg/dL (ref 0–99)
Total CHOL/HDL Ratio: 2.7 RATIO
Triglycerides: 106 mg/dL (ref <150)
VLDL: 21 mg/dL (ref 0–40)

## 2021-08-10 LAB — COMPREHENSIVE METABOLIC PANEL
ALT: 17 U/L (ref 0–44)
AST: 17 U/L (ref 15–41)
Albumin: 4 g/dL (ref 3.5–5.0)
Alkaline Phosphatase: 64 U/L (ref 38–126)
Anion gap: 7 (ref 5–15)
BUN: 10 mg/dL (ref 8–23)
CO2: 32 mmol/L (ref 22–32)
Calcium: 9.1 mg/dL (ref 8.9–10.3)
Chloride: 95 mmol/L — ABNORMAL LOW (ref 98–111)
Creatinine, Ser: 0.64 mg/dL (ref 0.44–1.00)
GFR, Estimated: >60 mL/min (ref >60)
Glucose, Bld: 132 mg/dL — ABNORMAL HIGH (ref 70–99)
Potassium: 5.5 mmol/L — ABNORMAL HIGH (ref 3.5–5.1)
Sodium: 134 mmol/L — ABNORMAL LOW (ref 135–145)
Total Bilirubin: 0.8 mg/dL (ref 0.3–1.2)
Total Protein: 7.1 g/dL (ref 6.5–8.1)

## 2021-08-12 ENCOUNTER — Other Ambulatory Visit (HOSPITAL_COMMUNITY)
Admission: RE | Admit: 2021-08-12 | Discharge: 2021-08-12 | Disposition: A | Discharge status: Home / Self Care | Payer: Medicaid Other | Source: Ambulatory Visit | Attending: Physician Assistant | Admitting: Physician Assistant

## 2021-08-12 ENCOUNTER — Encounter: Payer: Self-pay | Admitting: Physician Assistant

## 2021-08-12 ENCOUNTER — Ambulatory Visit: Payer: Medicaid Other | Admitting: Physician Assistant

## 2021-08-12 ENCOUNTER — Other Ambulatory Visit: Payer: Self-pay | Admitting: Physician Assistant

## 2021-08-12 VITALS — BP 156/88 | HR 73 | Temp 97.1°F | Wt 209.0 lb

## 2021-08-12 DIAGNOSIS — E875 Hyperkalemia: Secondary | ICD-10-CM

## 2021-08-12 DIAGNOSIS — F17200 Nicotine dependence, unspecified, uncomplicated: Secondary | ICD-10-CM

## 2021-08-12 DIAGNOSIS — Z131 Encounter for screening for diabetes mellitus: Secondary | ICD-10-CM

## 2021-08-12 DIAGNOSIS — I10 Essential (primary) hypertension: Secondary | ICD-10-CM

## 2021-08-12 DIAGNOSIS — R7309 Other abnormal glucose: Secondary | ICD-10-CM

## 2021-08-12 LAB — POTASSIUM: Potassium: 4.6 mmol/L (ref 3.5–5.1)

## 2021-08-12 LAB — HEMOGLOBIN A1C
Hgb A1c MFr Bld: 5.6 % (ref 4.8–5.6)
Mean Plasma Glucose: 114.02 mg/dL

## 2021-08-12 MED ORDER — LOSARTAN POTASSIUM 100 MG PO TABS
100.0000 mg | ORAL_TABLET | Freq: Every day | ORAL | 0 refills | Status: DC
Start: 2021-08-12 — End: 2021-10-20

## 2021-08-12 MED ORDER — METOPROLOL TARTRATE 50 MG PO TABS
25.0000 mg | ORAL_TABLET | Freq: Two times a day (BID) | ORAL | 0 refills | Status: DC
Start: 2021-08-12 — End: 2022-03-16

## 2021-08-12 MED ORDER — ATORVASTATIN CALCIUM 20 MG PO TABS: 20.0000 mg | ORAL_TABLET | Freq: Every day | ORAL | 0 refills | Status: DC

## 2021-08-12 NOTE — Progress Notes (Signed)
? ?BP (!) 168/85   Pulse 73   Temp (!) 97.1 ?F (36.2 ?C)   Wt 209 lb (94.8 kg)   SpO2 92%   BMI 35.87 kg/m?   ? ?Subjective:  ? ? Patient ID: Monique Harmon, female    DOB: 1960/06/08, 61 y.o.   MRN: [id number] ? ?HPI: ?Monique Harmon is a 61 y.o. female presenting on 08/12/2021 for Hyperlipidemia and Hypertension ? ? ?HPI ? ?Chief Complaint  ?Patient presents with  ? Hyperlipidemia  ? Hypertension  ? ? ? ?Pt says she does not have insurance.  She doesn't know if she will be getting it or not. ? ?She has been out of her losartan. ? ?She missed her appt last month.  ? ? ? ? ? ?Relevant past medical, surgical, family and social history reviewed and updated as indicated. Interim medical history since our last visit reviewed. ?Allergies and medications reviewed and updated. ? ? ?Current Outpatient Medications:  ?  atorvastatin (LIPITOR) 20 MG tablet, Take 1 tablet (20 mg total) by mouth daily., Disp: 90 tablet, Rfl: 0 ?  cetirizine (ZYRTEC) 10 MG tablet, Take 1 tablet (10 mg total) by mouth daily., Disp: 90 tablet, Rfl: 0 ?  IRBESARTAN PO, Take by mouth., Disp: , Rfl:  ?  metoprolol tartrate (LOPRESSOR) 50 MG tablet, Take 1 tablet (50 mg total) by mouth 2 (two) times daily., Disp: 180 tablet, Rfl: 0 ?  albuterol (VENTOLIN HFA) 108 (90 Base) MCG/ACT inhaler, Inhale 1-2 puffs into the lungs every 6 (six) hours as needed for wheezing or shortness of breath. (Patient not taking: Reported on 04/15/2021), Disp: 3 each, Rfl: 0 ?  losartan (COZAAR) 100 MG tablet, Take 1 tablet (100 mg total) by mouth daily. (Patient not taking: Reported on 08/12/2021), Disp: 90 tablet, Rfl: 0 ? ? ? ? ?Review of Systems ? ?Per HPI unless specifically indicated above ? ?   ?Objective:  ?  ?BP (!) 168/85   Pulse 73   Temp (!) 97.1 ?F (36.2 ?C)   Wt 209 lb (94.8 kg)   SpO2 92%   BMI 35.87 kg/m?   ?Wt Readings from Last 3 Encounters:  ?08/12/21 209 lb (94.8 kg)  ?04/15/21 208 lb (94.3 kg)  ?02/03/21 204 lb 8 oz (92.8 kg)  ?  ?Vitals:  ?  08/12/21 1428 08/12/21 1446  ?BP: (!) 168/85 (!) 156/88  ?Pulse: 73   ?Temp: (!) 97.1 ?F (36.2 ?C)   ?SpO2: 92%   ? ? ? ?Physical Exam ?Vitals reviewed.  ?Constitutional:   ?   General: She is not in acute distress. ?   Appearance: She is well-developed. She is not ill-appearing.  ?HENT:  ?   Head: Normocephalic and atraumatic.  ?Cardiovascular:  ?   Rate and Rhythm: Normal rate and regular rhythm.  ?Pulmonary:  ?   Effort: Pulmonary effort is normal.  ?   Breath sounds: Normal breath sounds.  ?Musculoskeletal:  ?   Cervical back: Neck supple.  ?   Right lower leg: No edema.  ?   Left lower leg: No edema.  ?Lymphadenopathy:  ?   Cervical: No cervical adenopathy.  ?Skin: ?   General: Skin is warm and dry.  ?Neurological:  ?   Mental Status: She is alert and oriented to person, place, and time.  ?Psychiatric:     ?   Behavior: Behavior normal.  ? ? ?Results for orders placed or performed during the hospital encounter of 08/10/21  ?Lipid panel  ?Result Value Ref  Range  ? Cholesterol 172 0 - 200 mg/dL  ? Triglycerides 106 <150 mg/dL  ? HDL 63 >40 mg/dL  ? Total CHOL/HDL Ratio 2.7 RATIO  ? VLDL 21 0 - 40 mg/dL  ? LDL Cholesterol 88 0 - 99 mg/dL  ?Comprehensive metabolic panel  ?Result Value Ref Range  ? Sodium 134 (L) 135 - 145 mmol/L  ? Potassium 5.5 (H) 3.5 - 5.1 mmol/L  ? Chloride 95 (L) 98 - 111 mmol/L  ? CO2 32 22 - 32 mmol/L  ? Glucose, Bld 132 (H) 70 - 99 mg/dL  ? BUN 10 8 - 23 mg/dL  ? Creatinine, Ser 0.64 0.44 - 1.00 mg/dL  ? Calcium 9.1 8.9 - 10.3 mg/dL  ? Total Protein 7.1 6.5 - 8.1 g/dL  ? Albumin 4.0 3.5 - 5.0 g/dL  ? AST 17 15 - 41 U/L  ? ALT 17 0 - 44 U/L  ? Alkaline Phosphatase 64 38 - 126 U/L  ? Total Bilirubin 0.8 0.3 - 1.2 mg/dL  ? GFR, Estimated >60 >60 mL/min  ? Anion gap 7 5 - 15  ? ?   ?Assessment & Plan:  ? ?Encounter Diagnoses  ?Name Primary?  ? Primary hypertension Yes  ? Hyperkalemia   ? Elevated glucose   ? Screening for diabetes mellitus   ? Tobacco use disorder   ? ? ?-reviewed labs with  pt ?-Pt counseled to resume the losartan ?-Pt counseled that the metoprolol should be taken bid ?-will recheck K+ due to hyperkalemia ?-will check a1c in  light of glucose 132 ?-pt will follow up 1 month to recheck bp ? ? ? ?

## 2021-09-09 ENCOUNTER — Encounter: Payer: Self-pay | Admitting: Physician Assistant

## 2021-09-09 ENCOUNTER — Ambulatory Visit: Payer: Medicaid Other | Admitting: Physician Assistant

## 2021-09-09 VITALS — BP 121/72 | HR 86 | Temp 97.1°F | Wt 202.0 lb

## 2021-09-09 DIAGNOSIS — Z5320 Procedure and treatment not carried out because of patient's decision for unspecified reasons: Secondary | ICD-10-CM

## 2021-09-09 DIAGNOSIS — F17200 Nicotine dependence, unspecified, uncomplicated: Secondary | ICD-10-CM

## 2021-09-09 DIAGNOSIS — I10 Essential (primary) hypertension: Secondary | ICD-10-CM

## 2021-09-09 NOTE — Progress Notes (Signed)
? ?BP 121/72   Pulse 86   Temp (!) 97.1 ?F (36.2 ?C)   Wt 202 lb (91.6 kg)   SpO2 92%   BMI 34.67 kg/m?   ? ?Subjective:  ? ? Patient ID: Monique Harmon, female    DOB: 06-29-1960, 61 y.o.   MRN: [id number] ? ?HPI: ?Kimberely Trauth is a 61 y.o. female presenting on 09/09/2021 for Hypertension ? ? ?HPI ? ?Chief Complaint  ?Patient presents with  ? Hypertension  ? ? ? ?Pt is still working at the schools as a Scientist, research (physical sciences).  She says she is doing well and has no complaints.    She is still smoking.  ? ? ?Relevant past medical, surgical, family and social history reviewed and updated as indicated. Interim medical history since our last visit reviewed. ?Allergies and medications reviewed and updated. ? ? ?Current Outpatient Medications:  ?  albuterol (VENTOLIN HFA) 108 (90 Base) MCG/ACT inhaler, Inhale 1-2 puffs into the lungs every 6 (six) hours as needed for wheezing or shortness of breath., Disp: 3 each, Rfl: 0 ?  atorvastatin (LIPITOR) 20 MG tablet, Take 1 tablet (20 mg total) by mouth daily., Disp: 90 tablet, Rfl: 0 ?  cetirizine (ZYRTEC) 10 MG tablet, Take 1 tablet (10 mg total) by mouth daily., Disp: 90 tablet, Rfl: 0 ?  losartan (COZAAR) 100 MG tablet, Take 1 tablet (100 mg total) by mouth daily., Disp: 90 tablet, Rfl: 0 ?  metoprolol tartrate (LOPRESSOR) 50 MG tablet, Take 0.5 tablets (25 mg total) by mouth 2 (two) times daily., Disp: 180 tablet, Rfl: 0 ? ? ? ?Review of Systems ? ?Per HPI unless specifically indicated above ? ?   ?Objective:  ?  ?BP 121/72   Pulse 86   Temp (!) 97.1 ?F (36.2 ?C)   Wt 202 lb (91.6 kg)   SpO2 92%   BMI 34.67 kg/m?   ?Wt Readings from Last 3 Encounters:  ?09/09/21 202 lb (91.6 kg)  ?08/12/21 209 lb (94.8 kg)  ?04/15/21 208 lb (94.3 kg)  ?  ?Physical Exam ?Vitals reviewed.  ?Constitutional:   ?   General: She is not in acute distress. ?   Appearance: She is well-developed. She is not toxic-appearing.  ?HENT:  ?   Head: Normocephalic and atraumatic.   ?Cardiovascular:  ?   Rate and Rhythm: Normal rate and regular rhythm.  ?Pulmonary:  ?   Effort: Pulmonary effort is normal.  ?   Breath sounds: Normal breath sounds.  ?Abdominal:  ?   General: Bowel sounds are normal.  ?   Palpations: Abdomen is soft. There is no mass.  ?   Tenderness: There is no abdominal tenderness.  ?Musculoskeletal:  ?   Cervical back: Neck supple.  ?   Right lower leg: No edema.  ?   Left lower leg: No edema.  ?Lymphadenopathy:  ?   Cervical: No cervical adenopathy.  ?Skin: ?   General: Skin is warm and dry.  ?Neurological:  ?   Mental Status: She is alert and oriented to person, place, and time.  ?Psychiatric:     ?   Behavior: Behavior normal.  ? ? ? ? ? ?   ?Assessment & Plan:  ? ?Encounter Diagnoses  ?Name Primary?  ? Primary hypertension Yes  ? Tobacco use disorder   ? Screening mammography declined   ? Colon cancer screening declined   ? ? ? ?-pt BP is great today ?-pt Declined screening mammogram and colon cancer screening ?-Counseled smoking  cessation ?-pt to follow up  3 months.  She is to contact office sooner prn ? ? ? ?

## 2021-10-20 ENCOUNTER — Ambulatory Visit: Payer: Medicaid Other | Admitting: Physician Assistant

## 2021-10-20 ENCOUNTER — Encounter: Payer: Self-pay | Admitting: Physician Assistant

## 2021-10-20 VITALS — BP 138/76 | HR 85 | Temp 97.2°F | Wt 205.0 lb

## 2021-10-20 DIAGNOSIS — K582 Mixed irritable bowel syndrome: Secondary | ICD-10-CM

## 2021-10-20 DIAGNOSIS — J449 Chronic obstructive pulmonary disease, unspecified: Secondary | ICD-10-CM

## 2021-10-20 DIAGNOSIS — F17200 Nicotine dependence, unspecified, uncomplicated: Secondary | ICD-10-CM

## 2021-10-20 DIAGNOSIS — R1084 Generalized abdominal pain: Secondary | ICD-10-CM

## 2021-10-20 MED ORDER — ATORVASTATIN CALCIUM 20 MG PO TABS: 20.0000 mg | ORAL_TABLET | Freq: Every day | ORAL | 0 refills | Status: DC

## 2021-10-20 MED ORDER — ALBUTEROL SULFATE HFA 108 (90 BASE) MCG/ACT IN AERS
1.0000 | INHALATION_SPRAY | Freq: Four times a day (QID) | RESPIRATORY_TRACT | 0 refills | Status: DC | PRN
Start: 2021-10-20 — End: 2022-03-16

## 2021-10-20 MED ORDER — DICYCLOMINE HCL 10 MG PO CAPS
10.0000 mg | ORAL_CAPSULE | Freq: Three times a day (TID) | ORAL | 1 refills | Status: DC
Start: 2021-10-20 — End: 2022-06-02

## 2021-10-20 MED ORDER — LOSARTAN POTASSIUM 100 MG PO TABS: 100.0000 mg | ORAL_TABLET | Freq: Every day | ORAL | 0 refills | Status: DC

## 2021-10-20 NOTE — Progress Notes (Signed)
BP 138/76   Pulse 85   Temp (!) 97.2 F (36.2 C)   Wt 205 lb (93 kg)   SpO2 90%   BMI 35.19 kg/m    Subjective:    Patient ID: Monique Harmon, female    DOB: 08/31/1960, 61 y.o.   MRN: [id number]  HPI: Monique Harmon is a 61 y.o. female presenting on 10/20/2021 for Abdominal Pain (Some diarrhea some constipation, nausea, bloating. Last diarrhea episode was this morning. Pt has been using laxative when constipated. Nausea has been going on on and off for 6 weeks. Other symptoms 6 months.)   HPI  Chief Complaint  Patient presents with   Abdominal Pain    Some diarrhea some constipation, nausea, bloating. Last diarrhea episode was this morning. Pt has been using laxative when constipated. Nausea has been going on on and off for 6 weeks. Other symptoms 6 months.    She has started having to eat smaller prortions and she is starting to fear food.  She is having intermittement diarrhea and constipation.   She has had no weight loss.    She had IBS about 10 years ago but no problems until about 6 months ago.    At her appt 09/09/21 she declined FIT test for colon cancer screening.  Last FIT test on record is 05/27/20 and it was negative.   She is having no problems swallowing  Abd pain comes and goes.  It is Worst right after eating.     She Feels sob at times.  Her O2 sat is chronically low, 92-90% RA.  She uses albuterol MDI several times/week.  She continues to smoke.       Relevant past medical, surgical, family and social history reviewed and updated as indicated. Interim medical history since our last visit reviewed. Allergies and medications reviewed and updated.   Current Outpatient Medications:    albuterol (VENTOLIN HFA) 108 (90 Base) MCG/ACT inhaler, Inhale 1-2 puffs into the lungs every 6 (six) hours as needed for wheezing or shortness of breath., Disp: 3 each, Rfl: 0   atorvastatin (LIPITOR) 20 MG tablet, Take 1 tablet (20 mg total) by mouth daily., Disp: 90  tablet, Rfl: 0   cetirizine (ZYRTEC) 10 MG tablet, Take 1 tablet (10 mg total) by mouth daily., Disp: 90 tablet, Rfl: 0   losartan (COZAAR) 100 MG tablet, Take 1 tablet (100 mg total) by mouth daily., Disp: 90 tablet, Rfl: 0   metoprolol tartrate (LOPRESSOR) 50 MG tablet, Take 0.5 tablets (25 mg total) by mouth 2 (two) times daily., Disp: 180 tablet, Rfl: 0   Review of Systems  Per HPI unless specifically indicated above     Objective:    BP 138/76   Pulse 85   Temp (!) 97.2 F (36.2 C)   Wt 205 lb (93 kg)   SpO2 90%   BMI 35.19 kg/m   Wt Readings from Last 3 Encounters:  10/20/21 205 lb (93 kg)  09/09/21 202 lb (91.6 kg)  08/12/21 209 lb (94.8 kg)    Physical Exam Vitals reviewed.  Constitutional:      General: She is not in acute distress.    Appearance: She is well-developed. She is not toxic-appearing.  HENT:     Head: Normocephalic and atraumatic.  Cardiovascular:     Rate and Rhythm: Normal rate and regular rhythm.  Pulmonary:     Effort: Pulmonary effort is normal.     Breath sounds: Normal breath sounds.  Abdominal:  General: Bowel sounds are normal.     Palpations: Abdomen is soft. There is no fluid wave, hepatomegaly, splenomegaly, mass or pulsatile mass.     Tenderness: There is no abdominal tenderness. There is no guarding or rebound.  Musculoskeletal:     Cervical back: Neck supple.     Right lower leg: No edema.     Left lower leg: No edema.  Lymphadenopathy:     Cervical: No cervical adenopathy.  Skin:    General: Skin is warm and dry.  Neurological:     Mental Status: She is alert and oriented to person, place, and time.  Psychiatric:        Behavior: Behavior normal.          Assessment & Plan:   Encounter Diagnoses  Name Primary?   Generalized abdominal pain Yes   Irritable bowel syndrome with both constipation and diarrhea    Chronic obstructive pulmonary disease, unspecified COPD type (HCC)    Tobacco use disorder      Abd  pain/likely IBS -Probiotics and bentyl and GI referral -she is given cafa/application for [employer] financial assistance  Copd/low O2 sats -Pt to continue prn albuterol.  Start wixela daily.  Encouraged smoking cessation   -she has follow up July 10.  She is to contact office sooner prn worsening or new symptoms.   -She says she is applying for insurance in september

## 2021-10-21 ENCOUNTER — Encounter (INDEPENDENT_AMBULATORY_CARE_PROVIDER_SITE_OTHER): Payer: Self-pay | Admitting: *Deleted

## 2021-11-23 ENCOUNTER — Other Ambulatory Visit: Payer: Self-pay | Admitting: Physician Assistant

## 2021-11-23 DIAGNOSIS — E785 Hyperlipidemia, unspecified: Secondary | ICD-10-CM

## 2021-11-23 DIAGNOSIS — I10 Essential (primary) hypertension: Secondary | ICD-10-CM

## 2021-11-30 ENCOUNTER — Encounter (INDEPENDENT_AMBULATORY_CARE_PROVIDER_SITE_OTHER): Payer: Self-pay | Admitting: Gastroenterology

## 2021-11-30 ENCOUNTER — Ambulatory Visit (INDEPENDENT_AMBULATORY_CARE_PROVIDER_SITE_OTHER): Payer: Medicaid Other | Admitting: Gastroenterology

## 2021-12-07 ENCOUNTER — Ambulatory Visit: Payer: Medicaid Other | Admitting: Physician Assistant

## 2022-01-06 ENCOUNTER — Ambulatory Visit: Payer: Medicaid Other | Admitting: Physician Assistant

## 2022-02-15 ENCOUNTER — Other Ambulatory Visit: Payer: Self-pay | Admitting: Physician Assistant

## 2022-02-25 ENCOUNTER — Other Ambulatory Visit: Payer: Self-pay | Admitting: Physician Assistant

## 2022-02-25 MED ORDER — LOSARTAN POTASSIUM 100 MG PO TABS: 100.0000 mg | ORAL_TABLET | Freq: Every day | ORAL | 0 refills | Status: DC

## 2022-02-25 NOTE — Progress Notes (Signed)
Pt called and says her insurance is starting sometime in October and she is in process of getting new patient appointment with a new doctor.  Rx losartan sent to [facility] [place].

## 2022-03-16 ENCOUNTER — Encounter: Payer: Self-pay | Admitting: Physician Assistant

## 2022-03-16 ENCOUNTER — Ambulatory Visit: Payer: Medicaid Other | Admitting: Physician Assistant

## 2022-03-16 VITALS — BP 136/71 | HR 81 | Temp 97.4°F | Wt 206.8 lb

## 2022-03-16 DIAGNOSIS — J449 Chronic obstructive pulmonary disease, unspecified: Secondary | ICD-10-CM | POA: Insufficient documentation

## 2022-03-16 DIAGNOSIS — E785 Hyperlipidemia, unspecified: Secondary | ICD-10-CM | POA: Insufficient documentation

## 2022-03-16 DIAGNOSIS — F17210 Nicotine dependence, cigarettes, uncomplicated: Secondary | ICD-10-CM | POA: Insufficient documentation

## 2022-03-16 DIAGNOSIS — I10 Essential (primary) hypertension: Secondary | ICD-10-CM

## 2022-03-16 DIAGNOSIS — F17200 Nicotine dependence, unspecified, uncomplicated: Secondary | ICD-10-CM

## 2022-03-16 MED ORDER — LOSARTAN POTASSIUM 100 MG PO TABS: 100.0000 mg | ORAL_TABLET | Freq: Every day | ORAL | 0 refills | Status: DC

## 2022-03-16 MED ORDER — FLUTICASONE-SALMETEROL 100-50 MCG/ACT IN AEPB: 1.0000 | INHALATION_SPRAY | Freq: Two times a day (BID) | RESPIRATORY_TRACT | 0 refills | Status: DC

## 2022-03-16 MED ORDER — ALBUTEROL SULFATE HFA 108 (90 BASE) MCG/ACT IN AERS: 1.0000 | INHALATION_SPRAY | Freq: Four times a day (QID) | RESPIRATORY_TRACT | 0 refills | Status: AC | PRN

## 2022-03-16 MED ORDER — METOPROLOL TARTRATE 50 MG PO TABS: 25.0000 mg | ORAL_TABLET | Freq: Two times a day (BID) | ORAL | 0 refills | Status: DC

## 2022-03-16 MED ORDER — ATORVASTATIN CALCIUM 20 MG PO TABS: 20.0000 mg | ORAL_TABLET | Freq: Every day | ORAL | 0 refills | Status: DC

## 2022-03-16 NOTE — Progress Notes (Signed)
BP 136/71   Pulse 81   Temp (!) 97.4 F (36.3 C)   Wt 206 lb 12 oz (93.8 kg)   SpO2 92%   BMI 35.49 kg/m    Subjective:    Patient ID: Monique Harmon, female    DOB: 08-19-1960, 61 y.o.   MRN: [id number]  HPI: Monique Harmon is a 61 y.o. female presenting on 03/16/2022 for Follow-up   HPI  Pt is 61yoF who is in to follow up HTN and dyslipidemia.   She has not been seen in office since May.  She cancelled her appointments scheduled for 12/07/21 an 01/06/22.  Pt says She is stressed.  She is working [employer].  She hopes to be out by November. 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   She is going on a cruise in December.  It is a 5 day cruise leaving out of [place].    She is using her fluticasone/salmeterol discus inahler and says she uses her albuterol inhaler maybe every other day.  She continues to smoke.  She doesn't smoke at work all day but then sometimes smokes a lot when she gets home in the evening.    Relevant past medical, surgical, family and social history reviewed and updated as indicated. Interim medical history since our last visit reviewed. Allergies and medications reviewed and updated.   Current Outpatient Medications:    albuterol (VENTOLIN HFA) 108 (90 Base) MCG/ACT inhaler, Inhale 1-2 puffs into the lungs every 6 (six) hours as needed for wheezing or shortness of breath., Disp: 3 each, Rfl: 0   atorvastatin (LIPITOR) 20 MG tablet, Take 1 tablet (20 mg total) by mouth daily., Disp: 90 tablet, Rfl: 0   cetirizine (ZYRTEC) 10 MG tablet, Take 1 tablet (10 mg total) by mouth daily., Disp: 90 tablet, Rfl: 0   Fluticasone-Salmeterol (WIXELA INHUB IN), Inhale 1 puff into the lungs daily., Disp: , Rfl:    ibuprofen (ADVIL) 200 MG tablet, Take 200 mg by mouth every 6 (six) hours as needed., Disp: , Rfl:    losartan (COZAAR) 100 MG tablet, Take 1 tablet (100 mg total) by mouth daily., Disp: 30 tablet, Rfl: 0   metoprolol tartrate (LOPRESSOR) 50 MG tablet, Take 0.5 tablets  (25 mg total) by mouth 2 (two) times daily., Disp: 180 tablet, Rfl: 0   dicyclomine (BENTYL) 10 MG capsule, Take 1 capsule (10 mg total) by mouth 4 (four) times daily -  before meals and at bedtime. (Patient not taking: Reported on 03/16/2022), Disp: 90 capsule, Rfl: 1   Review of Systems  Per HPI unless specifically indicated above     Objective:    BP 136/71   Pulse 81   Temp (!) 97.4 F (36.3 C)   Wt 206 lb 12 oz (93.8 kg)   SpO2 92%   BMI 35.49 kg/m   Wt Readings from Last 3 Encounters:  03/16/22 206 lb 12 oz (93.8 kg)  10/20/21 205 lb (93 kg)  09/09/21 202 lb (91.6 kg)    Physical Exam Vitals reviewed.  Constitutional:      General: She is not in acute distress.    Appearance: She is well-developed. She is obese. She is not toxic-appearing.  HENT:     Head: Normocephalic and atraumatic.  Cardiovascular:     Rate and Rhythm: Normal rate and regular rhythm.  Pulmonary:     Effort: Pulmonary effort is normal.     Breath sounds: Normal breath sounds.  Abdominal:  General: Bowel sounds are normal.     Palpations: Abdomen is soft. There is no mass.     Tenderness: There is no abdominal tenderness.  Musculoskeletal:     Cervical back: Neck supple.     Right lower leg: No edema.     Left lower leg: No edema.  Lymphadenopathy:     Cervical: No cervical adenopathy.  Skin:    General: Skin is warm and dry.  Neurological:     Mental Status: She is alert and oriented to person, place, and time.  Psychiatric:        Behavior: Behavior normal.           Assessment & Plan:   Encounter Diagnoses  Name Primary?   Primary hypertension Yes   Hyperlipidemia, unspecified hyperlipidemia type    Chronic obstructive pulmonary disease, unspecified COPD type ([place])    Tobacco use disorder      -She was referred to GI in May.  She has appointment scheduled for November  She says her diarrhea has improved with a probiotic  -pt Needs to update fasting labs.  Labs are  ordered and she will be called with results  -encouraged smoking cessation  -pt to continue current medications.  Refills sent to [facility]  -Her insurance starts January 1 and she has appointment to see new PCP in January.  She will not be scheduled to follow up here but is told to call if she needs to be seen before January 1

## 2022-03-19 ENCOUNTER — Ambulatory Visit: Payer: Medicaid Other | Admitting: Family Medicine

## 2022-04-08 ENCOUNTER — Encounter (INDEPENDENT_AMBULATORY_CARE_PROVIDER_SITE_OTHER): Payer: Self-pay | Admitting: Gastroenterology

## 2022-04-08 ENCOUNTER — Ambulatory Visit (INDEPENDENT_AMBULATORY_CARE_PROVIDER_SITE_OTHER): Payer: Self-pay | Admitting: Gastroenterology

## 2022-04-08 VITALS — BP 149/88 | HR 73 | Temp 98.2°F | Ht 64.0 in | Wt 204.3 lb

## 2022-04-08 DIAGNOSIS — R140 Abdominal distension (gaseous): Secondary | ICD-10-CM

## 2022-04-08 DIAGNOSIS — Z1211 Encounter for screening for malignant neoplasm of colon: Secondary | ICD-10-CM

## 2022-04-08 DIAGNOSIS — R197 Diarrhea, unspecified: Secondary | ICD-10-CM | POA: Insufficient documentation

## 2022-04-08 DIAGNOSIS — K582 Mixed irritable bowel syndrome: Secondary | ICD-10-CM

## 2022-04-08 NOTE — Patient Instructions (Addendum)
It was nice to meet you! We will get you scheduled for colonoscopy We will check celiac panel to rule out underlying gluten allergy  Please continue with probiotic daily I am providing the low FODMAP diet You should keep a food journal for the next few weeks to help correlate if symptoms are related to certain foods You can try eliminating FODMAPS listed on the provided handout. If you notice no change in your symptoms, you can slowly start adding these foods/ingredients back in to see if they are in fact tolerated. You should avoid foods that tend to illicit your symptoms. Stress can also play a big role in IBS, so stress management is important  Follow up 3 months

## 2022-04-08 NOTE — Progress Notes (Addendum)
Referring Provider: Jacquelin Hawking, PA-C Primary Care Physician:  Jacquelin Hawking, PA-C Primary GI Physician: new   Chief Complaint  Patient presents with   Irritable Bowel Syndrome    New patient. Referred for IBS. Was having diarrhea and now having constipation. About 2 months ago started taking a probiotic and bloating has gone away. Has noticed when stressed she has more diarrhea. Has never had a colonoscopy and would like to schedule for January after she gets new insurance.    HPI:   Monique Harmon is a 61 y.o. female with past medical history of depression, Hyperlipemia, HTN.   Patient presenting today as a new patient for IBS and screening colonoscopy   Patient reports history of IBS with both diarrhea and constipation as well as bloating. She notes that previous PPI therapy seemed to make diarrhea worse and stress also contributes. She stopped PPI and diarrhea seems somewhat better. She is now taking a probiotic which seems to have helped some. She notes previously having more constipation during the week when she was working, has gone as long as 3 days without a BM, will take something for this at that time. diarrhea usually occurring on the weekends though Now having a BM mostly every day with less bloating on the probiotic. She recently cut back on breads in her diet which seemed to have helped some too. She notes some abdominal discomfort at times, usually improved with a BM, no overt pain.  Denies rectal bleeding or melena.  No weight loss. Previously having some nausea but this has resolved since using probiotic. GERD symptoms fairly rare, maybe every 2-3 months, takes 1 tum with good results.  was given bentyl by PCP but read it could cause constipation so she never tried it.    NSAID use: ibuprofen on occasion  Social hx: drinks beer, maybe 4 beers per night. 1 PPD Fam hx: no crc, liver disease or pancreatic cancer  Last Colonoscopy: never Last  Endoscopy:never  Recommendations:    Past Medical History:  Diagnosis Date   Depression    Hyperlipemia    Hypertension     Past Surgical History:  Procedure Laterality Date   TONSILLECTOMY     TUBAL LIGATION      Current Outpatient Medications  Medication Sig Dispense Refill   albuterol (VENTOLIN HFA) 108 (90 Base) MCG/ACT inhaler Inhale 1-2 puffs into the lungs every 6 (six) hours as needed for wheezing or shortness of breath. 3 each 0   atorvastatin (LIPITOR) 20 MG tablet Take 1 tablet (20 mg total) by mouth daily. 90 tablet 0   cetirizine (ZYRTEC) 10 MG tablet Take 1 tablet (10 mg total) by mouth daily. 90 tablet 0   diphenhydrAMINE (BENADRYL) 25 MG tablet Take 25 mg by mouth. One qhs     fluticasone-salmeterol (WIXELA INHUB) 100-50 MCG/ACT AEPB Inhale 1 puff into the lungs 2 (two) times daily. 3 each 0   ibuprofen (ADVIL) 200 MG tablet Take 200 mg by mouth every 6 (six) hours as needed.     losartan (COZAAR) 100 MG tablet Take 1 tablet (100 mg total) by mouth daily. 90 tablet 0   metoprolol tartrate (LOPRESSOR) 50 MG tablet Take 0.5 tablets (25 mg total) by mouth 2 (two) times daily. 180 tablet 0   Probiotic Product (PROBIOTIC DAILY PO) Take by mouth. One daily     dicyclomine (BENTYL) 10 MG capsule Take 1 capsule (10 mg total) by mouth 4 (four) times daily -  before meals and  at bedtime. (Patient not taking: Reported on 04/08/2022) 90 capsule 1   No current facility-administered medications for this visit.    Allergies as of 04/08/2022 - Review Complete 04/08/2022  Allergen Reaction Noted   Neosporin [bacitracin-polymyxin b] Hives 05/08/2019    Family History  Problem Relation Age of Onset   Cancer Mother        uterine cancer, lung   COPD Mother    Diabetes Mother    Emphysema Father    Pneumonia Father     Social History   Socioeconomic History   Marital status: Single    Spouse name: Not on file   Number of children: Not on file   Years of education: Not  on file   Highest education level: Not on file  Occupational History   Not on file  Tobacco Use   Smoking status: Every Day    Packs/day: 1.00    Years: 46.00    Total pack years: 46.00    Types: Cigarettes    Passive exposure: Current   Smokeless tobacco: Never  Vaping Use   Vaping Use: Never used  Substance and Sexual Activity   Alcohol use: Yes    Comment: drinks 4 beers every day   Drug use: Not Currently   Sexual activity: Not on file  Other Topics Concern   Not on file  Social History Narrative   Not on file   Social Determinants of Health   Financial Resource Strain: Not on file  Food Insecurity: Not on file  Transportation Needs: Not on file  Physical Activity: Not on file  Stress: Not on file  Social Connections: Not on file    Review of systems General: negative for malaise, night sweats, fever, chills, weight loss Neck: Negative for lumps, goiter, pain and significant neck swelling Resp: Negative for cough, wheezing, dyspnea at rest CV: Negative for chest pain, leg swelling, palpitations, orthopnea GI: denies melena, hematochezia, nausea, vomiting, diarrhea, constipation, dysphagia, odyonophagia, early satiety or unintentional weight loss.  MSK: Negative for joint pain or swelling, back pain, and muscle pain. Derm: Negative for itching or rash Psych: Denies depression, anxiety, memory loss, confusion. No homicidal or suicidal ideation.  Heme: Negative for prolonged bleeding, bruising easily, and swollen nodes. Endocrine: Negative for cold or heat intolerance, polyuria, polydipsia and goiter. Neuro: negative for tremor, gait imbalance, syncope and seizures. The remainder of the review of systems is noncontributory.  Physical Exam: BP (!) 149/88 (BP Location: Left Arm, Patient Position: Sitting, Cuff Size: Large)   Pulse 73   Temp 98.2 F (36.8 C) (Oral)   Ht 5\' 4"  (1.626 m)   Wt 204 lb 4.8 oz (92.7 kg)   BMI 35.07 kg/m  General:   Alert and oriented.  No distress noted. Pleasant and cooperative.  Head:  Normocephalic and atraumatic. Eyes:  Conjuctiva clear without scleral icterus. Mouth:  Oral mucosa pink and moist. Good dentition. No lesions. Heart: Normal rate and rhythm, s1 and s2 heart sounds present.  Lungs: Clear lung sounds in all lobes. Respirations equal and unlabored. Abdomen:  +BS, soft, non-tender and non-distended. No rebound or guarding. No HSM or masses noted. Derm: No palmar erythema or jaundice Msk:  Symmetrical without gross deformities. Normal posture. Extremities:  Without edema. Neurologic:  Alert and  oriented x4 Psych:  Alert and cooperative. Normal mood and affect.  Invalid input(s): "6 MONTHS"   ASSESSMENT: Monique Harmon is a 61 y.o. female presenting today as a new patient for IBS and  to schedule screening colonoscopy.  Patient presents with long history of IBS-M. Has made dietary changes and started probiotic which seems to have helped significantly. She notes that she tries to manage stress as she knows this worsens diarrhea. Since starting probiotic she is having one BM per day. She has occasional abdominal discomfort but no overt pain. Denies rectal bleeding or melena. Has cut out breads as well which seems to have helped some with her symptoms. I explained indications of IBS with the patient to include hypersensitivity of the bowels that is often heavily influenced by certain food/drink triggers and emotional/mental triggers. We discussed the importance of keeping a food journal x2-3 weeks and utilizing the low FODMAP guide to help determine specific trigger foods and increasing foods that tend to be more well tolerated. She should continue with probiotic with good result, Will also check celiac panel to rule this out.   No previous screening colonoscopy, she is at average risk. Indications, risks and benefits of procedure discussed in detail with patient. Patient verbalized understanding and is in agreement to  proceed with screening Colonoscopy at this time.    PLAN:  Schedule colonoscopy, ASA II, ENDO 1 2. Continue daily probiotic  3. Low FODMAP and food/symptoms journal  4. Celiac testing  5. Stress management  All questions were answered, patient verbalized understanding and is in agreement with plan as outlined above.   Follow Up: 3 months   Christiann Hagerty L. Jeanmarie Hubert, MSN, APRN, AGNP-C Adult-Gerontology Nurse Practitioner [facility]  I have reviewed the note and agree with the APP's assessment as described in this progress note  Katrinka Blazing, MD Gastroenterology and Hepatology [facility]

## 2022-06-02 ENCOUNTER — Ambulatory Visit: Payer: BC Managed Care – PPO | Admitting: Family Medicine

## 2022-06-02 ENCOUNTER — Encounter: Payer: Self-pay | Admitting: Family Medicine

## 2022-06-02 VITALS — BP 134/86 | HR 83 | Temp 95.4°F | Ht 64.0 in | Wt 204.2 lb

## 2022-06-02 DIAGNOSIS — Z1211 Encounter for screening for malignant neoplasm of colon: Secondary | ICD-10-CM | POA: Diagnosis not present

## 2022-06-02 DIAGNOSIS — J449 Chronic obstructive pulmonary disease, unspecified: Secondary | ICD-10-CM | POA: Diagnosis not present

## 2022-06-02 DIAGNOSIS — F17200 Nicotine dependence, unspecified, uncomplicated: Secondary | ICD-10-CM

## 2022-06-02 DIAGNOSIS — E785 Hyperlipidemia, unspecified: Secondary | ICD-10-CM

## 2022-06-02 DIAGNOSIS — I10 Essential (primary) hypertension: Secondary | ICD-10-CM

## 2022-06-02 DIAGNOSIS — Z0000 Encounter for general adult medical examination without abnormal findings: Secondary | ICD-10-CM

## 2022-06-02 MED ORDER — VARENICLINE TARTRATE 0.5 MG PO TABS: ORAL_TABLET | ORAL | 0 refills | Status: DC

## 2022-06-02 NOTE — Assessment & Plan Note (Signed)
Stable currently.  Advised that she needs to quit smoking.  We discussed smoking cessation options.  Rx given for Chantix.  Good Rx coupon given as well.

## 2022-06-02 NOTE — Assessment & Plan Note (Signed)
Lipids well-controlled.  Continue Lipitor.

## 2022-06-02 NOTE — Assessment & Plan Note (Signed)
Declines vaccines today. Patient amenable to getting Pap smear by our nurse practitioner. Number given to schedule mammogram. Low-dose CT scan for lung cancer screening placed today after discussion with the patient.  She desires this. Referral to GI placed for colonoscopy.

## 2022-06-02 NOTE — Progress Notes (Signed)
Subjective:  Patient ID: Monique Harmon, female    DOB: 01/22/1961  Age: 62 y.o. MRN: [id number]  CC: Chief Complaint  Patient presents with   Establish Care    Former pt of [facility]. Pt had episode last night to where she woke up at night with racing heart beat and sweats and some SOB(has happened before) Pt having whooshing sound in ears.     HPI:  62 year old female with the below mentioned medical problems presents to establish care.  [facility] previously [facility] by [facility].  Patient's most recent labs earlier this year have been reviewed.  A1c 5.6.  Lipids well-controlled on Lipitor.  Patient has underlying COPD.  Continues to smoke.  She is interested in smoking cessation.  Is a candidate for CT lung cancer screening.  Will discuss this today.  Hypertension is well-controlled on losartan and metoprolol.  Patient is overdue for numerous healthcare maintenance items.  Declines flu vaccine.  Declines COVID-vaccine.  She is overdue for mammogram.  Patient declines HIV and hepatitis C screening.  She has not had a Pap smear in many years.  Has had prior cryotherapy.  Is overdue for colonoscopy as well.  Patient also reports that this morning she woke up and her heart was racing.  Some associated shortness of breath.  No chest pain.  Lasted approximately 10 minutes and resolved.  She has had this previously.  Patient Active Problem List   Diagnosis Date Noted   Healthcare maintenance 06/02/2022   Irritable bowel syndrome with both constipation and diarrhea 04/08/2022   Primary hypertension 03/16/2022   Hyperlipidemia 03/16/2022   Chronic obstructive pulmonary disease ([place]) 03/16/2022   Tobacco use disorder 03/16/2022    Social Hx   Social History   Socioeconomic History   Marital status: Single    Spouse name: Not on file   Number of children: Not on file   Years of education: Not on file   Highest education level: Not on file  Occupational History   Not on file   Tobacco Use   Smoking status: Every Day    Packs/day: 1.00    Years: 46.00    Total pack years: 46.00    Types: Cigarettes    Passive exposure: Current   Smokeless tobacco: Never  Vaping Use   Vaping Use: Never used  Substance and Sexual Activity   Alcohol use: Yes    Comment: drinks 4 beers every day   Drug use: Not Currently   Sexual activity: Not on file  Other Topics Concern   Not on file  Social History Narrative   Not on file   Social Determinants of Health   Financial Resource Strain: Not on file  Food Insecurity: Not on file  Transportation Needs: Not on file  Physical Activity: Not on file  Stress: Not on file  Social Connections: Not on file    Review of Systems Per HPI  Objective:  BP 134/86   Pulse 83   Temp (!) 95.4 F (35.2 C)   Ht 5\' 4"  (1.626 m)   Wt 204 lb 3.2 oz (92.6 kg)   SpO2 90%   BMI 35.05 kg/m      06/02/2022   10:48 AM 04/08/2022   10:07 AM 03/16/2022   10:49 AM  BP/Weight  Systolic BP 237 628 315  Diastolic BP 86 88 71  Wt. (Lbs) 204.2 204.3   BMI 35.05 kg/m2 35.07 kg/m2     Physical Exam Vitals and nursing note reviewed.  Constitutional:      General: She is not in acute distress.    Appearance: Normal appearance. She is obese.  HENT:     Head: Normocephalic and atraumatic.     Mouth/Throat:     Pharynx: Oropharynx is clear.  Eyes:     General:        Right eye: No discharge.        Left eye: No discharge.     Conjunctiva/sclera: Conjunctivae normal.  Cardiovascular:     Rate and Rhythm: Normal rate and regular rhythm.  Pulmonary:     Effort: Pulmonary effort is normal.     Breath sounds: Normal breath sounds. No wheezing, rhonchi or rales.  Neurological:     Mental Status: She is alert.  Psychiatric:        Mood and Affect: Mood normal.        Behavior: Behavior normal.     Lab Results  Component Value Date   GLUCOSE 132 (H) 08/10/2021   CHOL 172 08/10/2021   TRIG 106 08/10/2021   HDL 63 08/10/2021    LDLCALC 88 08/10/2021   ALT 17 08/10/2021   AST 17 08/10/2021   NA 134 (L) 08/10/2021   K 4.6 08/12/2021   CL 95 (L) 08/10/2021   CREATININE 0.64 08/10/2021   BUN 10 08/10/2021   CO2 32 08/10/2021   HGBA1C 5.6 08/12/2021     Assessment & Plan:   Problem List Items Addressed This Visit       Cardiovascular and Mediastinum   Primary hypertension    Well-controlled on losartan and metoprolol.  Continue.        Respiratory   Chronic obstructive pulmonary disease ([facility])    Stable currently.  Advised that she needs to quit smoking.  We discussed smoking cessation options.  Rx given for Chantix.  Good Rx coupon given as well.      Relevant Medications   varenicline (CHANTIX) 0.5 MG tablet     Other   Tobacco use disorder   Relevant Orders   CT CHEST LUNG CA SCREEN LOW DOSE W/O CM   Hyperlipidemia    Lipids well-controlled.  Continue Lipitor.      Healthcare maintenance    Declines vaccines today. Patient amenable to getting Pap smear by our nurse practitioner. Number given to schedule mammogram. Low-dose CT scan for lung cancer screening placed today after discussion with the patient.  She desires this. Referral to GI placed for colonoscopy.      Other Visit Diagnoses     Encounter for screening colonoscopy    -  Primary   Relevant Orders   Ambulatory referral to Gastroenterology       Meds ordered this encounter  Medications   varenicline (CHANTIX) 0.5 MG tablet    Sig: Days 1 to 3: 0.5 mg once daily. Days 4 to 7: 0.5 mg twice daily. Maintenance (day 8 and later): 1 mg twice daily    Dispense:  60 tablet    Refill:  0    Follow-up: 6 months  [place] DO [place]

## 2022-06-02 NOTE — Patient Instructions (Addendum)
Call [phone] to schedule mammogram.  Referral placed for colonoscopy.  Ordered placed for lung cancer screening.  Follow up when you can to get pap smear done.  Follow up in 6 months with me.  Take care  Dr. Lacinda Axon

## 2022-06-02 NOTE — Assessment & Plan Note (Signed)
Well-controlled on losartan and metoprolol.  Continue.

## 2022-06-10 ENCOUNTER — Encounter (INDEPENDENT_AMBULATORY_CARE_PROVIDER_SITE_OTHER): Payer: Self-pay | Admitting: *Deleted

## 2022-06-21 ENCOUNTER — Ambulatory Visit: Payer: BC Managed Care – PPO | Admitting: Family Medicine

## 2022-06-21 ENCOUNTER — Encounter: Payer: Self-pay | Admitting: Family Medicine

## 2022-06-21 VITALS — BP 109/68 | HR 73 | Temp 98.2°F | Wt 205.0 lb

## 2022-06-21 DIAGNOSIS — J9611 Chronic respiratory failure with hypoxia: Secondary | ICD-10-CM | POA: Insufficient documentation

## 2022-06-21 DIAGNOSIS — J441 Chronic obstructive pulmonary disease with (acute) exacerbation: Secondary | ICD-10-CM | POA: Insufficient documentation

## 2022-06-21 DIAGNOSIS — J449 Chronic obstructive pulmonary disease, unspecified: Secondary | ICD-10-CM

## 2022-06-21 MED ORDER — DOXYCYCLINE HYCLATE 100 MG PO TABS: 100.0000 mg | ORAL_TABLET | Freq: Two times a day (BID) | ORAL | 0 refills | Status: DC

## 2022-06-21 MED ORDER — PREDNISONE 50 MG PO TABS: ORAL_TABLET | ORAL | 0 refills | Status: DC

## 2022-06-21 MED ORDER — TRELEGY ELLIPTA 100-62.5-25 MCG/ACT IN AEPB: 1.0000 | INHALATION_SPRAY | Freq: Every day | RESPIRATORY_TRACT | 11 refills | Status: DC

## 2022-06-21 NOTE — Assessment & Plan Note (Signed)
Patient with current exacerbation.  Treating with doxycycline and prednisone. Will see if we can get Trelegy covered. Patient hypoxic today.  Requires oxygen.

## 2022-06-21 NOTE — Progress Notes (Signed)
Subjective:  Patient ID: Monique Harmon, female    DOB: Nov 18, 1960  Age: 62 y.o. MRN: [id number]  CC: Chief Complaint  Patient presents with   Shortness of Breath    Pt went [place] [place] on 06/19/22. Given Prednisone shot and was referred to ER due to low O2-pt did not go. Possible COPD?     HPI:  62 year old female with COPD and tobacco abuse presents for evaluation the above.  Patient states that she has been sick since last week.  She got worse on Thursday and Friday and subsequently was evaluated at a local urgent care on Saturday.  She has been experiencing shortness of breath and productive cough.  Has been wheezing as well.  She was hypoxic at urgent care and was advised to go to the hospital but refused.  She had a negative chest x-ray per the report.  I am not sure if this was read by radiologist.  She was given a corticosteroid injection.  Patient presents today for evaluation.  She is compliant with her albuterol and Advair.  Patient's oxygen saturation is 88-89 on room air here today.  Oxygen saturation drops to 85 with exertion.  Oxygen saturation 23% with application of 3 L of oxygen.  This was given via mask.  We did not have any nasal cannula available today.  Patient having significant shortness of breath with exertion.  When she is at rest with mild exertion she feels fine.  She states that she still has a harsh cough which is productive of thick green sputum.  Patient Active Problem List   Diagnosis Date Noted   COPD exacerbation ([place]) 06/21/2022   Chronic respiratory failure with hypoxia ([place]) 06/21/2022   Healthcare maintenance 06/02/2022   Irritable bowel syndrome with both constipation and diarrhea 04/08/2022   Primary hypertension 03/16/2022   Hyperlipidemia 03/16/2022   Chronic obstructive pulmonary disease ([place]) 03/16/2022   Tobacco use disorder 03/16/2022    Social Hx   Social History   Socioeconomic History   Marital status: Single    Spouse name: Not on  file   Number of children: Not on file   Years of education: Not on file   Highest education level: Not on file  Occupational History   Not on file  Tobacco Use   Smoking status: Every Day    Packs/day: 1.00    Years: 46.00    Total pack years: 46.00    Types: Cigarettes    Passive exposure: Current   Smokeless tobacco: Never  Vaping Use   Vaping Use: Never used  Substance and Sexual Activity   Alcohol use: Yes    Comment: drinks 4 beers every day   Drug use: Not Currently   Sexual activity: Not on file  Other Topics Concern   Not on file  Social History Narrative   Not on file   Social Determinants of Health   Financial Resource Strain: Not on file  Food Insecurity: Not on file  Transportation Needs: Not on file  Physical Activity: Not on file  Stress: Not on file  Social Connections: Not on file    Review of Systems Per HPI  Objective:  BP 109/68   Pulse 73   Temp 98.2 F (36.8 C)   Wt 205 lb (93 kg)   SpO2 90%   BMI 35.19 kg/m      06/21/2022   11:26 AM 06/02/2022   10:48 AM 04/08/2022   10:07 AM  BP/Weight  Systolic BP 557 322  841  Diastolic BP 68 86 88  Wt. (Lbs) 205 204.2 204.3  BMI 35.19 kg/m2 35.05 kg/m2 35.07 kg/m2    Physical Exam Vitals and nursing note reviewed.  Constitutional:      General: She is not in acute distress.    Appearance: She is obese.  Eyes:     General:        Right eye: No discharge.        Left eye: No discharge.     Conjunctiva/sclera: Conjunctivae normal.  Cardiovascular:     Rate and Rhythm: Normal rate and regular rhythm.  Pulmonary:     Effort: Pulmonary effort is normal.     Breath sounds: Rales present.  Neurological:     Mental Status: She is alert.  Psychiatric:        Mood and Affect: Mood normal.        Behavior: Behavior normal.     Lab Results  Component Value Date   GLUCOSE 132 (H) 08/10/2021   CHOL 172 08/10/2021   TRIG 106 08/10/2021   HDL 63 08/10/2021   LDLCALC 88 08/10/2021   ALT  17 08/10/2021   AST 17 08/10/2021   NA 134 (L) 08/10/2021   K 4.6 08/12/2021   CL 95 (L) 08/10/2021   CREATININE 0.64 08/10/2021   BUN 10 08/10/2021   CO2 32 08/10/2021   HGBA1C 5.6 08/12/2021     Assessment & Plan:   Problem List Items Addressed This Visit       Respiratory   Chronic obstructive pulmonary disease ([place])   Relevant Medications   predniSONE (DELTASONE) 50 MG tablet   Fluticasone-Umeclidin-Vilant (TRELEGY ELLIPTA) 100-62.5-25 MCG/ACT AEPB   Chronic respiratory failure with hypoxia ([place]) - Primary    Patient meets criteria for oxygen. Saturation 88 to 89% on room air.  Drops to 85 with mild exertion (walking).  Oxygen saturation 93% on 2 L and 95% on 3 L. Patient needs oxygen at home and portable tanks to be able to work.  Recommend 2 L via nasal cannula. Placing referral to pulmonology as well.      Relevant Orders   Ambulatory referral to Pulmonology   COPD exacerbation [facility])    Patient with current exacerbation.  Treating with doxycycline and prednisone. Will see if we can get Trelegy covered. Patient hypoxic today.  Requires oxygen.      Relevant Medications   predniSONE (DELTASONE) 50 MG tablet   Fluticasone-Umeclidin-Vilant (TRELEGY ELLIPTA) 100-62.5-25 MCG/ACT AEPB    Meds ordered this encounter  Medications   predniSONE (DELTASONE) 50 MG tablet    Sig: 1 tablet daily x 5 days    Dispense:  5 tablet    Refill:  0   doxycycline (VIBRA-TABS) 100 MG tablet    Sig: Take 1 tablet (100 mg total) by mouth 2 (two) times daily.    Dispense:  14 tablet    Refill:  0   Fluticasone-Umeclidin-Vilant (TRELEGY ELLIPTA) 100-62.5-25 MCG/ACT AEPB    Sig: Inhale 1 puff into the lungs daily.    Dispense:  1 each    Refill:  11    Follow-up:  Return in about 1 month (around 07/22/2022).  [place]

## 2022-06-21 NOTE — Assessment & Plan Note (Signed)
Patient meets criteria for oxygen. Saturation 88 to 89% on room air.  Drops to 85 with mild exertion (walking).  Oxygen saturation 93% on 2 L and 95% on 3 L. Patient needs oxygen at home and portable tanks to be able to work.  Recommend 2 L via nasal cannula. Placing referral to pulmonology as well.

## 2022-06-21 NOTE — Patient Instructions (Addendum)
Medications as prescribed.  I have placed the referral.   I am working on O2.  Follow up in 1 month

## 2022-06-21 NOTE — Assessment & Plan Note (Deleted)
Referring to pulmonology.

## 2022-06-25 ENCOUNTER — Other Ambulatory Visit: Payer: Self-pay

## 2022-06-25 MED ORDER — LOSARTAN POTASSIUM 100 MG PO TABS: 100.0000 mg | ORAL_TABLET | Freq: Every day | ORAL | 2 refills | Status: DC

## 2022-06-25 NOTE — Telephone Encounter (Signed)
Patient made aware per drs notes. 

## 2022-07-09 ENCOUNTER — Ambulatory Visit (INDEPENDENT_AMBULATORY_CARE_PROVIDER_SITE_OTHER): Payer: BC Managed Care – PPO | Admitting: Nurse Practitioner

## 2022-07-09 ENCOUNTER — Encounter: Payer: Self-pay | Admitting: Family Medicine

## 2022-07-09 ENCOUNTER — Encounter: Payer: Self-pay | Admitting: Nurse Practitioner

## 2022-07-09 VITALS — BP 120/79 | HR 70 | Ht 63.75 in | Wt 204.8 lb

## 2022-07-09 DIAGNOSIS — E785 Hyperlipidemia, unspecified: Secondary | ICD-10-CM | POA: Diagnosis not present

## 2022-07-09 DIAGNOSIS — Z01411 Encounter for gynecological examination (general) (routine) with abnormal findings: Secondary | ICD-10-CM

## 2022-07-09 DIAGNOSIS — Z124 Encounter for screening for malignant neoplasm of cervix: Secondary | ICD-10-CM

## 2022-07-09 DIAGNOSIS — Z01419 Encounter for gynecological examination (general) (routine) without abnormal findings: Secondary | ICD-10-CM

## 2022-07-09 DIAGNOSIS — F17200 Nicotine dependence, unspecified, uncomplicated: Secondary | ICD-10-CM

## 2022-07-09 DIAGNOSIS — I10 Essential (primary) hypertension: Secondary | ICD-10-CM

## 2022-07-09 DIAGNOSIS — Z1151 Encounter for screening for human papillomavirus (HPV): Secondary | ICD-10-CM

## 2022-07-09 MED ORDER — VARENICLINE TARTRATE 1 MG PO TABS: 1.0000 mg | ORAL_TABLET | Freq: Two times a day (BID) | ORAL | 1 refills | Status: DC

## 2022-07-09 MED ORDER — METOPROLOL TARTRATE 50 MG PO TABS: 25.0000 mg | ORAL_TABLET | Freq: Two times a day (BID) | ORAL | 0 refills | Status: DC

## 2022-07-09 MED ORDER — ATORVASTATIN CALCIUM 20 MG PO TABS: 20.0000 mg | ORAL_TABLET | Freq: Every day | ORAL | 0 refills | Status: DC

## 2022-07-09 NOTE — Progress Notes (Unsigned)
   Subjective:    Patient ID: Monique Harmon, female    DOB: 1960-12-08, 62 y.o.   MRN: [id number]  HPI The patient comes in today for a wellness visit.    A review of their health history was completed.  A review of medications was also completed.  Any needed refills; yes  Eating habits: excellent  Falls/  MVA accidents in past few months: no  Regular exercise: no  Specialist pt sees on regular basis: no  Preventative health issues were discussed.   Additional concerns: no concerns or issues today    Review of Systems     Objective:   Physical Exam        Assessment & Plan:

## 2022-07-09 NOTE — Progress Notes (Unsigned)
Subjective:    Patient ID: Monique Harmon, female    DOB: 1961/03/14, 62 y.o.   MRN: [id number]  HPI Monique Harmon is being seen in the office today for a routine physical and pap smear. Her diet is good. She does not exercise, and states that she "has a more sedentary lifestyle". She does smoke 1 pack of cigarettes a day. She is currently on Chantix, and it does help with her smoking. She also does drink four beers a night before bedtime for sleep. She also takes benadryl every night before bed, and says her sleep is good. She does have a history of COPD. She uses her Trelegy inhaler daily, which provides a lot of relief of symptoms for her. She is not currently having menstrual cycles, and has had a tubal ligation. She is not currently sexually active. She will follow up with Gastroenterology regarding her colonoscopy. She will also follow up with Pulmonology regarding her low-dose CT scan. She does get regular eye and dental exams.   Review of Systems  HENT:  Negative for trouble swallowing.   Respiratory:  Negative for chest tightness, shortness of breath and wheezing.   Cardiovascular:  Negative for chest pain.  Gastrointestinal:  Negative for constipation, diarrhea, nausea and vomiting.       Objective:   Physical Exam Vitals and nursing note reviewed.  Constitutional:      General: She is not in acute distress.    Appearance: Normal appearance. She is not ill-appearing.  Pulmonary:     Comments: Breath sounds are diminished. No wheezing.  Chest:     Comments: Breast exam deferred.  Abdominal:     General: There is no distension.     Palpations: Abdomen is soft. There is no mass.     Tenderness: There is no abdominal tenderness.  Genitourinary:    Comments: External genitalia- hair distribution normal. No vulvular erythema or lesions.  Vagina- vaginal walls pink, moist, rugae present, without lesions. No prolapses noted. Cervix- without lesions, discharge or bleeding. No polyps.   Bimanual- Negative cervical motion tenderness. Uterus is midline, mobile, soft and non-tender.  Lymphadenopathy:     Cervical: No cervical adenopathy.  Skin:    General: Skin is warm and dry.  Neurological:     Mental Status: She is alert.  Psychiatric:        Mood and Affect: Mood normal.        Behavior: Behavior normal.        Thought Content: Thought content normal.        Judgment: Judgment normal.    Vitals:   07/09/22 0828  BP: 120/79  Pulse: 70  Height: 5' 3.75" (1.619 m)  Weight: 204 lb 12.8 oz (92.9 kg)  SpO2: 95%  BMI (Calculated): 35.44       07/09/2022    8:35 AM 06/02/2022   10:48 AM 06/02/2022   10:47 AM  Depression screen PHQ 2/9  Decreased Interest 0 1 1  Down, Depressed, Hopeless 0 0 0  PHQ - 2 Score 0 1 1  Altered sleeping 1 2   Tired, decreased energy 1 2   Change in appetite 0 1   Feeling bad or failure about yourself  0 0   Trouble concentrating 0 0   Moving slowly or fidgety/restless 0 0   Suicidal thoughts 0 0   PHQ-9 Score 2 6   Difficult doing work/chores Not difficult at all Not difficult at all  07/09/2022    8:36 AM 06/02/2022   10:48 AM  GAD 7 : Generalized Anxiety Score  Nervous, Anxious, on Edge 1 0  Control/stop worrying 0 0  Worry too much - different things 1 0  Trouble relaxing 0 0  Restless 0 0  Easily annoyed or irritable 0 0  Afraid - awful might happen 0 0  Total GAD 7 Score 2 0  Anxiety Difficulty Not difficult at all Not difficult at all      Assessment & Plan:  1. Hyperlipidemia, unspecified hyperlipidemia type Continue Atorvastatin as prescribed.  -Educated on diet and increasing exercise as tolerated.  - Lipid panel  2. Primary hypertension Continue Losartan as prescribed. -Educated on diet and increasing exercise as tolerated.  - CBC with Differential - Comprehensive metabolic panel  3. Well woman exam with routine gynecological exam -Screened for HPV. -Screened for cervical cancer.  -CBC with  Differential - Comprehensive metabolic panel - Lipid panel - TSH -Recommend Tdap vaccine. Patient deferred at this time. Educated patient that she will need it if she gets a dirty wound or burn.  4. Tobacco use disorder -Increased Chantix dose to 38m two times daily.  -Educated patient on benefits of smoking cessation, and side effects associated with increase in Chantix dose.    Meds ordered this encounter  Medications   atorvastatin (LIPITOR) 20 MG tablet    Sig: Take 1 tablet (20 mg total) by mouth daily.    Dispense:  90 tablet    Refill:  0   metoprolol tartrate (LOPRESSOR) 50 MG tablet    Sig: Take 0.5 tablets (25 mg total) by mouth 2 (two) times daily.    Dispense:  180 tablet    Refill:  0   varenicline (CHANTIX) 1 MG tablet    Sig: Take 1 tablet (1 mg total) by mouth 2 (two) times daily.    Dispense:  60 tablet    Refill:  1    Order Specific Question:   Supervising Provider    Answer:   LKathyrn Drown[[id number]]   Return in about 1 year (around 07/10/2023) for physical.

## 2022-07-10 ENCOUNTER — Encounter: Payer: Self-pay | Admitting: Nurse Practitioner

## 2022-07-12 ENCOUNTER — Encounter (INDEPENDENT_AMBULATORY_CARE_PROVIDER_SITE_OTHER): Payer: Self-pay | Admitting: Gastroenterology

## 2022-07-12 ENCOUNTER — Encounter (INDEPENDENT_AMBULATORY_CARE_PROVIDER_SITE_OTHER): Payer: Self-pay | Admitting: *Deleted

## 2022-07-12 ENCOUNTER — Ambulatory Visit (INDEPENDENT_AMBULATORY_CARE_PROVIDER_SITE_OTHER): Payer: BC Managed Care – PPO | Admitting: Gastroenterology

## 2022-07-12 VITALS — BP 131/81 | HR 70 | Temp 98.2°F | Ht 63.5 in | Wt 203.5 lb

## 2022-07-12 DIAGNOSIS — K582 Mixed irritable bowel syndrome: Secondary | ICD-10-CM

## 2022-07-12 DIAGNOSIS — Z1211 Encounter for screening for malignant neoplasm of colon: Secondary | ICD-10-CM

## 2022-07-12 MED ORDER — PEG 3350-KCL-NA BICARB-NACL 420 G PO SOLR: 4000.0000 mL | Freq: Once | ORAL | 0 refills | Status: AC

## 2022-07-12 NOTE — H&P (View-Only) (Signed)
Referring Provider: Soyla Dryer, PA-C Primary Care Physician:  Coral Spikes, DO Primary GI Physician: Jenetta Downer   Chief Complaint  Patient presents with   Irritable Bowel Syndrome    Follow up on IBS. Reports doing better since taking probiotic daily. Would like to schedule her first colonoscopy today now that she has insurance.    HPI:   Monique Harmon is a 62 y.o. female with past medical history of depression, Hyperlipemia, HTN.    Patient presenting today for follow up of IBS-M  Last seen November 2023,  reports history of IBS with both diarrhea and constipation as well as bloating. previous PPI therapy seemed to make diarrhea worse and stress also contributes. She stopped PPI and diarrhea seems somewhat better. taking a probiotic which seems to have helped some, previously having more constipation during the week when she was working, has gone as long as 3 days without a BM, will take something for this at that time. diarrhea usually occurring on the weekends though Now having a BM mostly every day with less bloating on the probiotic. Having  some abdominal discomfort at times, usually improved with a BM, no overt pain. GERD symptoms fairly rare, maybe every 2-3 months, takes 1 tum with good results.  was given bentyl by PCP but read it could cause constipation so she never tried it.     Recommend schedule colonoscopy, daily probiotic, low FODMAP diet, celiac testing and stress management   Present:  Patient states that GI symptoms have resolved with use of probiotic, having daily BM usually in the morning. Appetite is good, denies unintentional weight loss. Denies abdominal pain. Bloating Improved as well with probiotic. She has insurance now and is wanting to get her colonoscopy scheduled.   No red flag symptoms. Patient denies melena, hematochezia, nausea, vomiting, diarrhea, constipation, dysphagia, odyonophagia, early satiety or weight loss.   Last Colonoscopy: never  Last  Endoscopy: never  Recommendations:    Past Medical History:  Diagnosis Date   Depression    Hyperlipemia    Hypertension     Past Surgical History:  Procedure Laterality Date   TONSILLECTOMY     TUBAL LIGATION      Current Outpatient Medications  Medication Sig Dispense Refill   albuterol (VENTOLIN HFA) 108 (90 Base) MCG/ACT inhaler Inhale 1-2 puffs into the lungs every 6 (six) hours as needed for wheezing or shortness of breath. 3 each 0   atorvastatin (LIPITOR) 20 MG tablet Take 1 tablet (20 mg total) by mouth daily. 90 tablet 0   cetirizine (ZYRTEC) 10 MG tablet Take 1 tablet (10 mg total) by mouth daily. 90 tablet 0   diphenhydrAMINE (BENADRYL) 25 MG tablet Take 25 mg by mouth. One qhs     Fluticasone-Umeclidin-Vilant (TRELEGY ELLIPTA) 100-62.5-25 MCG/ACT AEPB Inhale 1 puff into the lungs daily. 1 each 11   losartan (COZAAR) 100 MG tablet Take 1 tablet (100 mg total) by mouth daily. 30 tablet 2   metoprolol tartrate (LOPRESSOR) 50 MG tablet Take 0.5 tablets (25 mg total) by mouth 2 (two) times daily. 180 tablet 0   Probiotic Product (PROBIOTIC DAILY PO) Take by mouth. One daily     varenicline (CHANTIX) 1 MG tablet Take 1 tablet (1 mg total) by mouth 2 (two) times daily. 60 tablet 1   No current facility-administered medications for this visit.    Allergies as of 07/12/2022 - Review Complete 07/12/2022  Allergen Reaction Noted   Neosporin [bacitracin-polymyxin b] Hives 05/08/2019  Family History  Problem Relation Age of Onset   Cancer Mother        uterine cancer, lung   COPD Mother    Diabetes Mother    Emphysema Father    Pneumonia Father     Social History   Socioeconomic History   Marital status: Single    Spouse name: Not on file   Number of children: Not on file   Years of education: Not on file   Highest education level: Not on file  Occupational History   Not on file  Tobacco Use   Smoking status: Every Day    Packs/day: 1.00    Years: 46.00     Total pack years: 46.00    Types: Cigarettes    Passive exposure: Current   Smokeless tobacco: Never  Vaping Use   Vaping Use: Never used  Substance and Sexual Activity   Alcohol use: Yes    Alcohol/week: 28.0 standard drinks of alcohol    Types: 28 Cans of beer per week    Comment: drinks 4 beers every day   Drug use: Not Currently   Sexual activity: Not Currently    Birth control/protection: Post-menopausal, Surgical  Other Topics Concern   Not on file  Social History Narrative   Not on file   Social Determinants of Health   Financial Resource Strain: Not on file  Food Insecurity: Not on file  Transportation Needs: Not on file  Physical Activity: Not on file  Stress: Not on file  Social Connections: Not on file    Review of systems General: negative for malaise, night sweats, fever, chills, weight loss Neck: Negative for lumps, goiter, pain and significant neck swelling Resp: Negative for cough, wheezing, dyspnea at rest CV: Negative for chest pain, leg swelling, palpitations, orthopnea GI: denies melena, hematochezia, nausea, vomiting, diarrhea, constipation, dysphagia, odyonophagia, early satiety or unintentional weight loss.  MSK: Negative for joint pain or swelling, back pain, and muscle pain. Derm: Negative for itching or rash Psych: Denies depression, anxiety, memory loss, confusion. No homicidal or suicidal ideation.  Heme: Negative for prolonged bleeding, bruising easily, and swollen nodes. Endocrine: Negative for cold or heat intolerance, polyuria, polydipsia and goiter. Neuro: negative for tremor, gait imbalance, syncope and seizures. The remainder of the review of systems is noncontributory.  Physical Exam: There were no vitals taken for this visit. General:   Alert and oriented. No distress noted. Pleasant and cooperative.  Head:  Normocephalic and atraumatic. Eyes:  Conjuctiva clear without scleral icterus. Mouth:  Oral mucosa pink and moist. Good  dentition. No lesions. Heart: Normal rate and rhythm, s1 and s2 heart sounds present.  Lungs: Clear lung sounds in all lobes. Respirations equal and unlabored. Abdomen:  +BS, soft, non-tender and non-distended. No rebound or guarding. No HSM or masses noted. Derm: No palmar erythema or jaundice Msk:  Symmetrical without gross deformities. Normal posture. Extremities:  Without edema. Neurologic:  Alert and  oriented x4 Psych:  Alert and cooperative. Normal mood and affect.  Invalid input(s): "6 MONTHS"   ASSESSMENT: Monique Harmon is a 62 y.o. female presenting today for follow up of IBS-M.  IBS-M: well controlled on daily probiotic. Having a daily BM without constipation or diarrhea. Will continue with current regimen.  At last visit, patient recommended to have screening colonoscopy which she was unable to schedule due to lack of insurance, she is ready to schedule today. Indications, risks and benefits of procedure discussed in detail with patient. Patient verbalized understanding  and is in agreement to proceed with colonoscopy.    PLAN:  Continue with daily probiotic  2. Schedule colonoscopy-ASA II ENDO 1   All questions were answered, patient verbalized understanding and is in agreement with plan as outlined above.   Follow Up:  1 year  Clovis Warwick L. Alver Sorrow, MSN, APRN, AGNP-C Adult-Gerontology Nurse Practitioner [facility]  I have reviewed the note and agree with the APP's assessment as described in this progress note  Maylon Peppers, MD Gastroenterology and Hepatology [facility]

## 2022-07-12 NOTE — Progress Notes (Addendum)
Referring Provider: Soyla Dryer, PA-C Primary Care Physician:  Coral Spikes, DO Primary GI Physician: Jenetta Downer   Chief Complaint  Patient presents with   Irritable Bowel Syndrome    Follow up on IBS. Reports doing better since taking probiotic daily. Would like to schedule her first colonoscopy today now that she has insurance.    HPI:   Monique Harmon is a 62 y.o. female with past medical history of depression, Hyperlipemia, HTN.    Patient presenting today for follow up of IBS-M  Last seen November 2023,  reports history of IBS with both diarrhea and constipation as well as bloating. previous PPI therapy seemed to make diarrhea worse and stress also contributes. She stopped PPI and diarrhea seems somewhat better. taking a probiotic which seems to have helped some, previously having more constipation during the week when she was working, has gone as long as 3 days without a BM, will take something for this at that time. diarrhea usually occurring on the weekends though Now having a BM mostly every day with less bloating on the probiotic. Having  some abdominal discomfort at times, usually improved with a BM, no overt pain. GERD symptoms fairly rare, maybe every 2-3 months, takes 1 tum with good results.  was given bentyl by PCP but read it could cause constipation so she never tried it.     Recommend schedule colonoscopy, daily probiotic, low FODMAP diet, celiac testing and stress management   Present:  Patient states that GI symptoms have resolved with use of probiotic, having daily BM usually in the morning. Appetite is good, denies unintentional weight loss. Denies abdominal pain. Bloating Improved as well with probiotic. She has insurance now and is wanting to get her colonoscopy scheduled.   No red flag symptoms. Patient denies melena, hematochezia, nausea, vomiting, diarrhea, constipation, dysphagia, odyonophagia, early satiety or weight loss.   Last Colonoscopy: never  Last  Endoscopy: never  Recommendations:    Past Medical History:  Diagnosis Date   Depression    Hyperlipemia    Hypertension     Past Surgical History:  Procedure Laterality Date   TONSILLECTOMY     TUBAL LIGATION      Current Outpatient Medications  Medication Sig Dispense Refill   albuterol (VENTOLIN HFA) 108 (90 Base) MCG/ACT inhaler Inhale 1-2 puffs into the lungs every 6 (six) hours as needed for wheezing or shortness of breath. 3 each 0   atorvastatin (LIPITOR) 20 MG tablet Take 1 tablet (20 mg total) by mouth daily. 90 tablet 0   cetirizine (ZYRTEC) 10 MG tablet Take 1 tablet (10 mg total) by mouth daily. 90 tablet 0   diphenhydrAMINE (BENADRYL) 25 MG tablet Take 25 mg by mouth. One qhs     Fluticasone-Umeclidin-Vilant (TRELEGY ELLIPTA) 100-62.5-25 MCG/ACT AEPB Inhale 1 puff into the lungs daily. 1 each 11   losartan (COZAAR) 100 MG tablet Take 1 tablet (100 mg total) by mouth daily. 30 tablet 2   metoprolol tartrate (LOPRESSOR) 50 MG tablet Take 0.5 tablets (25 mg total) by mouth 2 (two) times daily. 180 tablet 0   Probiotic Product (PROBIOTIC DAILY PO) Take by mouth. One daily     varenicline (CHANTIX) 1 MG tablet Take 1 tablet (1 mg total) by mouth 2 (two) times daily. 60 tablet 1   No current facility-administered medications for this visit.    Allergies as of 07/12/2022 - Review Complete 07/12/2022  Allergen Reaction Noted   Neosporin [bacitracin-polymyxin b] Hives 05/08/2019  Family History  Problem Relation Age of Onset   Cancer Mother        uterine cancer, lung   COPD Mother    Diabetes Mother    Emphysema Father    Pneumonia Father     Social History   Socioeconomic History   Marital status: Single    Spouse name: Not on file   Number of children: Not on file   Years of education: Not on file   Highest education level: Not on file  Occupational History   Not on file  Tobacco Use   Smoking status: Every Day    Packs/day: 1.00    Years: 46.00     Total pack years: 46.00    Types: Cigarettes    Passive exposure: Current   Smokeless tobacco: Never  Vaping Use   Vaping Use: Never used  Substance and Sexual Activity   Alcohol use: Yes    Alcohol/week: 28.0 standard drinks of alcohol    Types: 28 Cans of beer per week    Comment: drinks 4 beers every day   Drug use: Not Currently   Sexual activity: Not Currently    Birth control/protection: Post-menopausal, Surgical  Other Topics Concern   Not on file  Social History Narrative   Not on file   Social Determinants of Health   Financial Resource Strain: Not on file  Food Insecurity: Not on file  Transportation Needs: Not on file  Physical Activity: Not on file  Stress: Not on file  Social Connections: Not on file    Review of systems General: negative for malaise, night sweats, fever, chills, weight loss Neck: Negative for lumps, goiter, pain and significant neck swelling Resp: Negative for cough, wheezing, dyspnea at rest CV: Negative for chest pain, leg swelling, palpitations, orthopnea GI: denies melena, hematochezia, nausea, vomiting, diarrhea, constipation, dysphagia, odyonophagia, early satiety or unintentional weight loss.  MSK: Negative for joint pain or swelling, back pain, and muscle pain. Derm: Negative for itching or rash Psych: Denies depression, anxiety, memory loss, confusion. No homicidal or suicidal ideation.  Heme: Negative for prolonged bleeding, bruising easily, and swollen nodes. Endocrine: Negative for cold or heat intolerance, polyuria, polydipsia and goiter. Neuro: negative for tremor, gait imbalance, syncope and seizures. The remainder of the review of systems is noncontributory.  Physical Exam: There were no vitals taken for this visit. General:   Alert and oriented. No distress noted. Pleasant and cooperative.  Head:  Normocephalic and atraumatic. Eyes:  Conjuctiva clear without scleral icterus. Mouth:  Oral mucosa pink and moist. Good  dentition. No lesions. Heart: Normal rate and rhythm, s1 and s2 heart sounds present.  Lungs: Clear lung sounds in all lobes. Respirations equal and unlabored. Abdomen:  +BS, soft, non-tender and non-distended. No rebound or guarding. No HSM or masses noted. Derm: No palmar erythema or jaundice Msk:  Symmetrical without gross deformities. Normal posture. Extremities:  Without edema. Neurologic:  Alert and  oriented x4 Psych:  Alert and cooperative. Normal mood and affect.  Invalid input(s): "6 MONTHS"   ASSESSMENT: Echo Cook is a 62 y.o. female presenting today for follow up of IBS-M.  IBS-M: well controlled on daily probiotic. Having a daily BM without constipation or diarrhea. Will continue with current regimen.  At last visit, patient recommended to have screening colonoscopy which she was unable to schedule due to lack of insurance, she is ready to schedule today. Indications, risks and benefits of procedure discussed in detail with patient. Patient verbalized understanding  and is in agreement to proceed with colonoscopy.    PLAN:  Continue with daily probiotic  2. Schedule colonoscopy-ASA II ENDO 1   All questions were answered, patient verbalized understanding and is in agreement with plan as outlined above.   Follow Up:  1 year  Isaah Furry L. Alver Sorrow, MSN, APRN, AGNP-C Adult-Gerontology Nurse Practitioner [facility]  I have reviewed the note and agree with the APP's assessment as described in this progress note  Maylon Peppers, MD Gastroenterology and Hepatology [facility]

## 2022-07-12 NOTE — Patient Instructions (Signed)
Please continue with probiotic  We will schedule you for colonoscopy  It was a pleasure to see you today. I want to create trusting relationships with patients and provide genuine, compassionate, and quality care. I truly value your feedback! please be on the lookout for a survey regarding your visit with me today. I appreciate your input about our visit and your time in completing this!    Cherica Heiden L. Alver Sorrow, MSN, APRN, AGNP-C Adult-Gerontology Nurse Practitioner [facility]

## 2022-07-12 NOTE — Addendum Note (Signed)
Addended by: Harvel Quale on: 07/12/2022 10:06 PM   Modules accepted: Level of Service

## 2022-07-15 LAB — IGP, APTIMA HPV: HPV Aptima: NEGATIVE

## 2022-07-19 ENCOUNTER — Telehealth: Payer: Self-pay

## 2022-07-19 ENCOUNTER — Other Ambulatory Visit: Payer: Self-pay

## 2022-07-19 DIAGNOSIS — Z129 Encounter for screening for malignant neoplasm, site unspecified: Secondary | ICD-10-CM

## 2022-07-19 NOTE — Telephone Encounter (Signed)
Spoke with patient and informed her Monique Harmon wanted her scheduled for a mammogram. Patient stated a Wednesday afternoon would be best for mammogram. Informed patient that I would get it scheduled and she would be informed via MyChart of appointment. Patient verbalized understanding.

## 2022-07-22 ENCOUNTER — Telehealth: Payer: Self-pay | Admitting: Family Medicine

## 2022-07-22 NOTE — Telephone Encounter (Signed)
Left message to return call. Will need to get symptoms of yeast infection. Trelegy has 11 refills already.

## 2022-07-22 NOTE — Telephone Encounter (Signed)
Patient requesting refill on luticasone-Umeclidin-Vilant (TRELEGY ELLIPTA) 100-62.5-25 MCG/ACT AEPB  and prescription for diflucan in pill form for yeast infection sent to [facility]

## 2022-07-26 ENCOUNTER — Encounter: Payer: Self-pay | Admitting: Family Medicine

## 2022-07-26 ENCOUNTER — Ambulatory Visit: Payer: BC Managed Care – PPO | Admitting: Family Medicine

## 2022-07-26 VITALS — BP 125/76 | HR 111 | Temp 99.1°F | Ht 63.5 in | Wt 197.0 lb

## 2022-07-26 DIAGNOSIS — R509 Fever, unspecified: Secondary | ICD-10-CM | POA: Diagnosis not present

## 2022-07-26 DIAGNOSIS — J441 Chronic obstructive pulmonary disease with (acute) exacerbation: Secondary | ICD-10-CM | POA: Diagnosis not present

## 2022-07-26 MED ORDER — AZITHROMYCIN 250 MG PO TABS: ORAL_TABLET | ORAL | 0 refills | Status: AC

## 2022-07-26 MED ORDER — PREDNISONE 50 MG PO TABS: ORAL_TABLET | ORAL | 0 refills | Status: DC

## 2022-07-26 MED ORDER — AMOXICILLIN-POT CLAVULANATE 875-125 MG PO TABS: 1.0000 | ORAL_TABLET | Freq: Two times a day (BID) | ORAL | 0 refills | Status: DC

## 2022-07-26 NOTE — Progress Notes (Signed)
Subjective:  Patient ID: Monique Harmon, female    DOB: 06-03-1960  Age: 62 y.o. MRN: [id number]  CC: Chief Complaint  Patient presents with   Nasal Congestion    Since Friday cough, chest congestion w darkcolor sputum, charlie horse in left ribcage   Ear Pain   Fever    Low grade     HPI:  62 year old female with COPD and chronic respiratory failure presents for evaluation of the above.  Patient states that she has been sick since Friday.  She reports bilateral ear pain, sore throat, chest congestion, productive cough, and low-grade fever Tmax 100.2.  Patient has not gotten oxygen therapy due to being unable to afford.  No reported sick contacts.  No relieving factors.  Patient Active Problem List   Diagnosis Date Noted   COPD exacerbation ([place]) 06/21/2022   Chronic respiratory failure with hypoxia ([place]) 06/21/2022   Irritable bowel syndrome with both constipation and diarrhea 04/08/2022   Encounter for screening colonoscopy 04/08/2022   Primary hypertension 03/16/2022   Hyperlipidemia 03/16/2022   Chronic obstructive pulmonary disease ([place]) 03/16/2022   Tobacco use disorder 03/16/2022    Social Hx   Social History   Socioeconomic History   Marital status: Single    Spouse name: Not on file   Number of children: Not on file   Years of education: Not on file   Highest education level: Not on file  Occupational History   Not on file  Tobacco Use   Smoking status: Every Day    Packs/day: 1.00    Years: 46.00    Total pack years: 46.00    Types: Cigarettes    Passive exposure: Current   Smokeless tobacco: Never  Vaping Use   Vaping Use: Never used  Substance and Sexual Activity   Alcohol use: Yes    Alcohol/week: 28.0 standard drinks of alcohol    Types: 28 Cans of beer per week    Comment: drinks 4 beers every day   Drug use: Not Currently   Sexual activity: Not Currently    Birth control/protection: Post-menopausal, Surgical  Other Topics Concern   Not on  file  Social History Narrative   Not on file   Social Determinants of Health   Financial Resource Strain: Not on file  Food Insecurity: Not on file  Transportation Needs: Not on file  Physical Activity: Not on file  Stress: Not on file  Social Connections: Not on file    Review of Systems Per HPI  Objective:  BP 125/76   Pulse (!) 111   Temp 99.1 F (37.3 C)   Ht 5' 3.5" (1.613 m)   Wt 197 lb (89.4 kg)   SpO2 91%   BMI 34.35 kg/m      07/26/2022    4:02 PM 07/26/2022    3:50 PM 07/12/2022    9:34 AM  BP/Weight  Systolic BP 0000000 [id number]  Diastolic BP 76 83 81  Wt. (Lbs)  197 203.5  BMI  34.35 kg/m2 35.48 kg/m2    Physical Exam Vitals and nursing note reviewed.  Constitutional:      Comments: Chronically ill-appearing female in no acute distress.  HENT:     Head: Normocephalic and atraumatic.     Ears:     Comments: TMs with bulging and erythema bilaterally.    Mouth/Throat:     Pharynx: Posterior oropharyngeal erythema present.  Eyes:     General:        Right eye:  No discharge.        Left eye: No discharge.     Conjunctiva/sclera: Conjunctivae normal.  Pulmonary:     Effort: Pulmonary effort is normal.     Breath sounds: Rales present.  Psychiatric:        Mood and Affect: Mood normal.        Behavior: Behavior normal.     Lab Results  Component Value Date   GLUCOSE 132 (H) 08/10/2021   CHOL 172 08/10/2021   TRIG 106 08/10/2021   HDL 63 08/10/2021   LDLCALC 88 08/10/2021   ALT 17 08/10/2021   AST 17 08/10/2021   NA 134 (L) 08/10/2021   K 4.6 08/12/2021   CL 95 (L) 08/10/2021   CREATININE 0.64 08/10/2021   BUN 10 08/10/2021   CO2 32 08/10/2021   HGBA1C 5.6 08/12/2021     Assessment & Plan:   Problem List Items Addressed This Visit       Respiratory   COPD exacerbation ([place]) - Primary    Patient presenting with COPD exacerbation.  Concern for developing pneumonia.   Patient has otitis media bilaterally on exam.  Placing on  Augmentin.  Also placing on azithromycin to cover for pneumonia. Prednisone as well.  Work note given.      Relevant Medications   azithromycin (ZITHROMAX) 250 MG tablet   predniSONE (DELTASONE) 50 MG tablet   Other Visit Diagnoses     Fever, unspecified fever cause       Relevant Orders   COVID-19, Flu A+B and RSV       Meds ordered this encounter  Medications   amoxicillin-clavulanate (AUGMENTIN) 875-125 MG tablet    Sig: Take 1 tablet by mouth 2 (two) times daily.    Dispense:  14 tablet    Refill:  0   azithromycin (ZITHROMAX) 250 MG tablet    Sig: Take 2 tablets on day 1, then 1 tablet daily on days 2 through 5    Dispense:  6 tablet    Refill:  0   predniSONE (DELTASONE) 50 MG tablet    Sig: 1 tablet daily x 5 days    Dispense:  5 tablet    Refill:  0    Follow-up:  Return if symptoms worsen or fail to improve.  [place]

## 2022-07-26 NOTE — Telephone Encounter (Signed)
Patient notified and stated she is on her way to office for an office visit with Dr Lacinda Axon and will ask him about the diflucan for yeast infection.

## 2022-07-26 NOTE — Patient Instructions (Signed)
Medications as prescribed.  Rest.  If you worsen, go to the hospital.  Take care  Dr. Lacinda Axon

## 2022-07-26 NOTE — Assessment & Plan Note (Signed)
Patient presenting with COPD exacerbation.  Concern for developing pneumonia.   Patient has otitis media bilaterally on exam.  Placing on Augmentin.  Also placing on azithromycin to cover for pneumonia. Prednisone as well.  Work note given.

## 2022-07-27 ENCOUNTER — Institutional Professional Consult (permissible substitution): Payer: BC Managed Care – PPO | Admitting: Internal Medicine

## 2022-07-28 ENCOUNTER — Ambulatory Visit (HOSPITAL_COMMUNITY): Payer: BC Managed Care – PPO

## 2022-07-28 ENCOUNTER — Telehealth: Payer: Self-pay | Admitting: Family Medicine

## 2022-07-28 LAB — COVID-19, FLU A+B AND RSV
Influenza A, NAA: NOT DETECTED
Influenza B, NAA: NOT DETECTED
RSV, NAA: NOT DETECTED
SARS-CoV-2, NAA: NOT DETECTED

## 2022-07-28 NOTE — Telephone Encounter (Signed)
Patient is requesting extended work note ,she was seen on 2/26 and will try to return on 2/28 due to sickening. She would like results of labs done on 2/26.

## 2022-07-28 NOTE — Telephone Encounter (Signed)
Patient has been informed per drs result notes , patient would like to return to work tomorrow.

## 2022-07-29 ENCOUNTER — Encounter: Payer: Self-pay | Admitting: Family Medicine

## 2022-08-02 ENCOUNTER — Encounter: Payer: Self-pay | Admitting: Family Medicine

## 2022-08-02 ENCOUNTER — Telehealth: Payer: Self-pay | Admitting: Family Medicine

## 2022-08-02 NOTE — Telephone Encounter (Signed)
Patient is requesting work excuse for diarrhea from antibiotic from last week, She couldn't go to work today. Please advise

## 2022-08-02 NOTE — Telephone Encounter (Signed)
Coral Spikes, DO     Okay to give work note.

## 2022-08-04 ENCOUNTER — Ambulatory Visit (HOSPITAL_COMMUNITY): Payer: BC Managed Care – PPO | Admitting: Certified Registered"

## 2022-08-04 ENCOUNTER — Encounter (INDEPENDENT_AMBULATORY_CARE_PROVIDER_SITE_OTHER): Payer: Self-pay | Admitting: *Deleted

## 2022-08-04 ENCOUNTER — Encounter (HOSPITAL_COMMUNITY)
Admission: RE | Disposition: A | Discharge status: Home / Self Care | Payer: Self-pay | Source: Home / Self Care | Attending: Gastroenterology

## 2022-08-04 ENCOUNTER — Encounter (HOSPITAL_COMMUNITY): Payer: Self-pay | Admitting: Gastroenterology

## 2022-08-04 ENCOUNTER — Ambulatory Visit (HOSPITAL_COMMUNITY)
Admission: RE | Admit: 2022-08-04 | Discharge: 2022-08-04 | Disposition: A | Discharge status: Home / Self Care | Payer: BC Managed Care – PPO | Attending: Gastroenterology | Admitting: Gastroenterology

## 2022-08-04 ENCOUNTER — Other Ambulatory Visit: Payer: Self-pay

## 2022-08-04 DIAGNOSIS — E785 Hyperlipidemia, unspecified: Secondary | ICD-10-CM | POA: Insufficient documentation

## 2022-08-04 DIAGNOSIS — Z1211 Encounter for screening for malignant neoplasm of colon: Secondary | ICD-10-CM | POA: Insufficient documentation

## 2022-08-04 DIAGNOSIS — D124 Benign neoplasm of descending colon: Secondary | ICD-10-CM

## 2022-08-04 DIAGNOSIS — D123 Benign neoplasm of transverse colon: Secondary | ICD-10-CM | POA: Diagnosis not present

## 2022-08-04 DIAGNOSIS — D122 Benign neoplasm of ascending colon: Secondary | ICD-10-CM | POA: Diagnosis not present

## 2022-08-04 DIAGNOSIS — K589 Irritable bowel syndrome without diarrhea: Secondary | ICD-10-CM | POA: Insufficient documentation

## 2022-08-04 DIAGNOSIS — K5730 Diverticulosis of large intestine without perforation or abscess without bleeding: Secondary | ICD-10-CM

## 2022-08-04 DIAGNOSIS — I10 Essential (primary) hypertension: Secondary | ICD-10-CM | POA: Diagnosis not present

## 2022-08-04 DIAGNOSIS — K648 Other hemorrhoids: Secondary | ICD-10-CM | POA: Insufficient documentation

## 2022-08-04 DIAGNOSIS — D120 Benign neoplasm of cecum: Secondary | ICD-10-CM | POA: Diagnosis not present

## 2022-08-04 DIAGNOSIS — D121 Benign neoplasm of appendix: Secondary | ICD-10-CM | POA: Diagnosis not present

## 2022-08-04 DIAGNOSIS — F17210 Nicotine dependence, cigarettes, uncomplicated: Secondary | ICD-10-CM | POA: Insufficient documentation

## 2022-08-04 HISTORY — PX: POLYPECTOMY: SHX5525

## 2022-08-04 HISTORY — PX: COLONOSCOPY WITH PROPOFOL: SHX5780

## 2022-08-04 HISTORY — DX: Other complications of anesthesia, initial encounter: T88.59XA

## 2022-08-04 LAB — HM COLONOSCOPY

## 2022-08-04 SURGERY — COLONOSCOPY WITH PROPOFOL
Anesthesia: General

## 2022-08-04 MED ORDER — LIDOCAINE HCL (CARDIAC) PF 100 MG/5ML IV SOSY
PREFILLED_SYRINGE | INTRAVENOUS | Status: DC | PRN
  Administered 2022-08-04: 50 mg via INTRAVENOUS

## 2022-08-04 MED ORDER — PROPOFOL 500 MG/50ML IV EMUL
INTRAVENOUS | Status: DC | PRN
  Administered 2022-08-04: 150 ug/kg/min via INTRAVENOUS

## 2022-08-04 MED ORDER — LACTATED RINGERS IV SOLN: INTRAVENOUS | Status: DC

## 2022-08-04 MED ORDER — PROPOFOL 10 MG/ML IV BOLUS
INTRAVENOUS | Status: DC | PRN
  Administered 2022-08-04 (×2): 50 mg via INTRAVENOUS
  Administered 2022-08-04: 30 mg via INTRAVENOUS
  Administered 2022-08-04: 50 mg via INTRAVENOUS
  Administered 2022-08-04: 100 mg via INTRAVENOUS
  Administered 2022-08-04: 30 mg via INTRAVENOUS

## 2022-08-04 MED ORDER — LACTATED RINGERS IV SOLN: INTRAVENOUS | Status: DC | PRN

## 2022-08-04 NOTE — Op Note (Signed)
[facility] Patient Name: Monique Harmon Procedure Date: 08/04/2022 10:47 AM MRN: [id number] Date of Birth: 30-Jul-1960 Attending MD: Maylon Peppers , , [id number] CSN: [id number] Age: 62 Admit Type: Outpatient Procedure:                Colonoscopy Indications:              Screening for colorectal malignant neoplasm Providers:                Maylon Peppers, Caprice Kluver, [place], Technician Referring MD:              Medicines:                Monitored Anesthesia Care Complications:            No immediate complications. Estimated Blood Loss:     Estimated blood loss: none. Procedure:                Pre-Anesthesia Assessment:                           - Prior to the procedure, a History and Physical                            was performed, and patient medications, allergies                            and sensitivities were reviewed. The patient's                            tolerance of previous anesthesia was reviewed.                           - The risks and benefits of the procedure and the                            sedation options and risks were discussed with the                            patient. All questions were answered and informed                            consent was obtained.                           - ASA Grade Assessment: II - A patient with mild                            systemic disease.                           After obtaining informed consent, the colonoscope                            was passed under direct vision. Throughout the  procedure, the patient's blood pressure, pulse, and                            oxygen saturations were monitored continuously. The                            [id number] scope was introduced through                            the anus and advanced to the the cecum, identified                            by appendiceal orifice and ileocecal valve.  The                            colonoscopy was performed without difficulty. The                            patient tolerated the procedure well. The quality                            of the bowel preparation was excellent. Scope In: 11:05:04 AM Scope Out: 11:33:17 AM Scope Withdrawal Time: 0 hours 20 minutes 57 seconds  Total Procedure Duration: 0 hours 28 minutes 13 seconds  Findings:      The perianal and digital rectal examinations were normal.      Two sessile polyps were found in the ascending colon and appendiceal       orifice. The polyps were 3 to 5 mm in size. These polyps were removed       with a cold snare. Resection and retrieval were complete.      Two sessile polyps were found in the ascending colon. The polyps were 1       mm in size. These polyps were removed with a cold biopsy forceps.       Resection and retrieval were complete.      Three sessile polyps were found in the transverse colon. The polyps were       3 to 5 mm in size. These polyps were removed with a cold snare.       Resection and retrieval were complete.      A 4 mm polyp was found in the descending colon. The polyp was sessile.       The polyp was removed with a cold snare. Resection and retrieval were       complete.      Two sessile polyps were found in the descending colon. The polyps were 1       mm in size. These polyps were removed with a cold biopsy forceps.       Resection and retrieval were complete.      A few small-mouthed diverticula were found in the sigmoid colon.      Non-bleeding internal hemorrhoids were found during retroflexion. The       hemorrhoids were small. Impression:               - Two 3 to 5 mm polyps in the ascending colon and  at the appendiceal orifice, removed with a cold                            snare. Resected and retrieved.                           - Two 1 mm polyps in the ascending colon, removed                            with a cold  biopsy forceps. Resected and retrieved.                           - Three 3 to 5 mm polyps in the transverse colon,                            removed with a cold snare. Resected and retrieved.                           - One 4 mm polyp in the descending colon, removed                            with a cold snare. Resected and retrieved.                           - Two 1 mm polyps in the descending colon, removed                            with a cold biopsy forceps. Resected and retrieved.                           - Diverticulosis in the sigmoid colon.                           - Non-bleeding internal hemorrhoids. Moderate Sedation:      Per Anesthesia Care Recommendation:           - Discharge patient to home (ambulatory).                           - Resume previous diet.                           - Await pathology results.                           - Repeat colonoscopy for surveillance based on                            pathology results. Procedure Code(s):        --- Professional ---                           [phone], Colonoscopy, flexible; with removal of  tumor(s), polyp(s), or other lesion(s) by snare                            technique                           45380, 59, Colonoscopy, flexible; with biopsy,                            single or multiple Diagnosis Code(s):        --- Professional ---                           Z12.11, Encounter for screening for malignant                            neoplasm of colon                           D12.1, Benign neoplasm of appendix                           D12.2, Benign neoplasm of ascending colon                           D12.3, Benign neoplasm of transverse colon (hepatic                            flexure or splenic flexure)                           D12.4, Benign neoplasm of descending colon                           K64.8, Other hemorrhoids                           K57.30, Diverticulosis of large  intestine without                            perforation or abscess without bleeding CPT copyright 2022 American Medical Association. All rights reserved. The codes documented in this report are preliminary and upon coder review may  be revised to meet current compliance requirements. Maylon Peppers, MD Maylon Peppers,  08/04/2022 11:39:36 AM This report has been signed electronically. Number of Addenda: 0

## 2022-08-04 NOTE — Anesthesia Procedure Notes (Signed)
Date/Time: 08/04/2022 11:15 AM  Performed by: Orlie Dakin, CRNAPre-anesthesia Checklist: Patient identified, Emergency Drugs available, Suction available and Patient being monitored Patient Re-evaluated:Patient Re-evaluated prior to induction Oxygen Delivery Method: Non-rebreather mask Induction Type: IV induction

## 2022-08-04 NOTE — Anesthesia Preprocedure Evaluation (Signed)
Anesthesia Evaluation  Patient identified by MRN, date of birth, ID band Patient awake    Reviewed: Allergy & Precautions, H&P , NPO status , Patient's Chart, lab work & pertinent test results, reviewed documented beta blocker date and time   History of Anesthesia Complications (+) history of anesthetic complications  Airway Mallampati: II  TM Distance: >3 FB Neck ROM: full    Dental no notable dental hx.    Pulmonary neg pulmonary ROS, Current Smoker   Pulmonary exam normal breath sounds clear to auscultation       Cardiovascular Exercise Tolerance: Good hypertension, negative cardio ROS  Rhythm:regular Rate:Normal     Neuro/Psych    Depression    negative neurological ROS  negative psych ROS   GI/Hepatic negative GI ROS, Neg liver ROS,,,  Endo/Other  negative endocrine ROS    Renal/GU negative Renal ROS  negative genitourinary   Musculoskeletal negative musculoskeletal ROS (+)    Abdominal   Peds negative pediatric ROS (+)  Hematology negative hematology ROS (+)   Anesthesia Other Findings   Reproductive/Obstetrics negative OB ROS                             Anesthesia Physical Anesthesia Plan  ASA: 2  Anesthesia Plan: General   Post-op Pain Management:    Induction:   PONV Risk Score and Plan: Propofol infusion  Airway Management Planned:   Additional Equipment:   Intra-op Plan:   Post-operative Plan:   Informed Consent: I have reviewed the patients History and Physical, chart, labs and discussed the procedure including the risks, benefits and alternatives for the proposed anesthesia with the patient or authorized representative who has indicated his/her understanding and acceptance.     Dental Advisory Given  Plan Discussed with: CRNA  Anesthesia Plan Comments:        Anesthesia Quick Evaluation

## 2022-08-04 NOTE — Interval H&P Note (Signed)
History and Physical Interval Note:  08/04/2022 9:57 AM  Monique Harmon  has presented today for surgery, with the diagnosis of screening.  The various methods of treatment have been discussed with the patient and family. After consideration of risks, benefits and other options for treatment, the patient has consented to  Procedure(s) with comments: COLONOSCOPY WITH PROPOFOL (N/A) - 1045am, asa 2 as a surgical intervention.  The patient's history has been reviewed, patient examined, no change in status, stable for surgery.  I have reviewed the patient's chart and labs.  Questions were answered to the patient's satisfaction.     Maylon Peppers [place]

## 2022-08-04 NOTE — Transfer of Care (Signed)
Immediate Anesthesia Transfer of Care Note  Patient: Monique Harmon  Procedure(s) Performed: COLONOSCOPY WITH PROPOFOL POLYPECTOMY  Patient Location: Endoscopy Unit  Anesthesia Type:General  Level of Consciousness: awake  Airway & Oxygen Therapy: Patient Spontanous Breathing  Post-op Assessment: Report given to RN and Post -op Vital signs reviewed and stable  Post vital signs: Reviewed and stable  Last Vitals:  Vitals Value Taken Time  BP    Temp    Pulse    Resp    SpO2      Last Pain:  Vitals:   08/04/22 1101  TempSrc:   PainSc: 0-No pain      Patients Stated Pain Goal: 8 (0000000 [id number])  Complications: No notable events documented.

## 2022-08-04 NOTE — Discharge Instructions (Addendum)
You are being discharged to home.  Resume your previous diet.  We are waiting for your pathology results.  Your physician has recommended a repeat colonoscopy for surveillance based on pathology results.  

## 2022-08-05 LAB — SURGICAL PATHOLOGY

## 2022-08-05 NOTE — Anesthesia Postprocedure Evaluation (Signed)
Anesthesia Post Note  Patient: Monique Harmon  Procedure(s) Performed: COLONOSCOPY WITH PROPOFOL POLYPECTOMY  Patient location during evaluation: Phase II Anesthesia Type: General Level of consciousness: awake Pain management: pain level controlled Vital Signs Assessment: post-procedure vital signs reviewed and stable Respiratory status: spontaneous breathing and respiratory function stable Cardiovascular status: blood pressure returned to baseline and stable Postop Assessment: no headache and no apparent nausea or vomiting Anesthetic complications: no Comments: Late entry   No notable events documented.   Last Vitals:  Vitals:   08/04/22 0940 08/04/22 1137  BP: (!) 150/72 (!) 128/56  Pulse: 80 85  Resp: 20 20  Temp: 36.7 C 36.6 C  SpO2: 92% 97%    Last Pain:  Vitals:   08/04/22 1137  TempSrc: Oral  PainSc: 0-No pain                 Louann Sjogren

## 2022-08-16 ENCOUNTER — Encounter (HOSPITAL_COMMUNITY): Payer: Self-pay | Admitting: Gastroenterology

## 2022-08-17 ENCOUNTER — Other Ambulatory Visit: Payer: Self-pay

## 2022-08-17 ENCOUNTER — Telehealth: Payer: Self-pay

## 2022-08-17 MED ORDER — FLUCONAZOLE 150 MG PO TABS: ORAL_TABLET | ORAL | 0 refills | Status: DC

## 2022-08-17 NOTE — Telephone Encounter (Signed)
Tried calling patient to inform prescription has been sent to the pharmacy

## 2022-08-17 NOTE — Telephone Encounter (Signed)
Pt was seen by Dr Lacinda Axon was told if she started having symptoms he would call her something in she feels that she is getting a yeast infection. Vaginal discharge and oder.   Osie [phone]  Pharmacy [facility] in [place]

## 2022-08-25 ENCOUNTER — Ambulatory Visit (HOSPITAL_COMMUNITY)
Admission: RE | Admit: 2022-08-25 | Discharge: 2022-08-25 | Disposition: A | Discharge status: Home / Self Care | Payer: BC Managed Care – PPO | Source: Ambulatory Visit | Attending: Nurse Practitioner | Admitting: Nurse Practitioner

## 2022-08-25 DIAGNOSIS — Z129 Encounter for screening for malignant neoplasm, site unspecified: Secondary | ICD-10-CM | POA: Diagnosis not present

## 2022-08-25 DIAGNOSIS — Z1231 Encounter for screening mammogram for malignant neoplasm of breast: Secondary | ICD-10-CM | POA: Insufficient documentation

## 2022-08-25 NOTE — Progress Notes (Unsigned)
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  referred to pulmonary clinic in [place]  08/26/2022 by Elliot Gurney  for copd eval with onset around 2020 much worse since Nov 2023 better on Trelegy but could not afford     History of Present Illness  08/26/2022  Pulmonary/ 1st office eval/ Kristjan Derner / [place] on Advair 100 one daily  Chief Complaint  Patient presents with   Consult    Suspected COPD  Was prescribed O2 but cannot afford   Dyspnea:   back before breathing but one flight of steps esp if carrying groceries  Cough: cough / congestion worse first thing in am's then continues all day thick white s/p recent augmentin rx Sleep: flat bed / on L side preferably, one pillow no resp  SABA use: maybe once a day  02: none  Lung cancer screen: Dr Lacinda Axon already referred to [facility]  No obvious day to day or daytime pattern/variability or assoc  purulent sputum or mucus plugs or hemoptysis or cp or chest tightness, subjective wheeze or overt sinus or hb symptoms.   Sleeping  without nocturnal  or early am exacerbation  of respiratory  c/o's or need for noct saba. Also denies any obvious fluctuation of symptoms with weather or environmental changes or other aggravating or alleviating factors except as outlined above   No unusual exposure hx or h/o childhood pna/ asthma or knowledge of premature birth.  Current Allergies, Complete Past Medical History, Past Surgical History, Family History, and Social History were reviewed in [employer] record.  ROS  The following are not active complaints unless bolded Hoarseness, sore throat, dysphagia, dental problems, itching, sneezing,  nasal congestion or discharge of excess mucus or purulent secretions, ear ache,   fever, chills, sweats, unintended wt loss or wt gain, classically pleuritic or exertional cp,  orthopnea pnd or arm/hand  swelling  or leg swelling, presyncope, palpitations, abdominal pain, anorexia, nausea, vomiting, diarrhea  or change in bowel habits or change in bladder habits, change in stools or change in urine, dysuria, hematuria,  rash, arthralgias, visual complaints, headache, numbness, weakness or ataxia or problems with walking or coordination,  change in mood or  memory.              Past Medical History:  Diagnosis Date   Complication of anesthesia    Depression    Hyperlipemia    Hypertension     Outpatient Medications Prior to Visit  Medication Sig Dispense Refill   albuterol (VENTOLIN HFA) 108 (90 Base) MCG/ACT inhaler Inhale 1-2 puffs into the lungs every 6 (six) hours as needed for wheezing or shortness of breath. 3 each 0   amoxicillin-clavulanate (AUGMENTIN) 875-125 MG tablet Take 1 tablet by mouth 2 (two) times daily. 14 tablet 0   atorvastatin (LIPITOR) 20 MG tablet Take 1 tablet (20 mg total) by mouth daily. 90 tablet 0   cetirizine (ZYRTEC) 10 MG tablet Take 1 tablet (10 mg total) by mouth daily. 90 tablet 0   diphenhydrAMINE (BENADRYL) 25 MG tablet Take 25 mg by mouth at bedtime.     fluconazole (DIFLUCAN) 150 MG tablet Take one tablet by mouth 3 days apart 2 tablet 0   fluticasone-salmeterol (ADVAIR) 100-50 MCG/ACT AEPB Inhale 1 puff into the lungs daily.     Fluticasone-Umeclidin-Vilant (TRELEGY ELLIPTA) 100-62.5-25 MCG/ACT AEPB Inhale 1 puff into the lungs daily.  1 each 11   losartan (COZAAR) 100 MG tablet Take 1 tablet (100 mg total) by mouth daily. 30 tablet 2   metoprolol tartrate (LOPRESSOR) 50 MG tablet Take 0.5 tablets (25 mg total) by mouth 2 (two) times daily. (Patient taking differently: Take 25 mg by mouth daily.) 180 tablet 0   Probiotic Product (PROBIOTIC DAILY PO) Take by mouth. One daily     varenicline (CHANTIX) 1 MG tablet Take 1 tablet (1 mg total) by mouth 2 (two) times daily. 60 tablet 1   No facility-administered medications prior to visit.      Objective:     BP 136/86 (BP Location: Left Arm, Patient Position: Sitting)   Pulse 88   Ht 5' 3.5" (1.613 m)   Wt 203 lb 3.2 oz (92.2 kg)   SpO2 (!) 89% Comment: ra  BMI 35.43 kg/m   SpO2: (!) 89 % (ra)  Amb hoarse mod obese by BMI wf nad/ typical smoker's rattle  HEENT :  Oropharynx  clear      NECK :  without JVD/Nodes/TM/ nl carotid upstrokes bilaterally   LUNGS: no acc muscle use,  Mod barrel  contour chest wall with bilateral  Distant bs s audible wheeze and  without cough on insp or exp maneuvers and mod  Hyperresonant  to  percussion bilaterally     CV:  RRR  no s3 or murmur or increase in P2, and no edema   ABD:  soft and nontender with pos late insp Hoover's  in the supine position. No bruits or organomegaly appreciated, bowel sounds nl  MS:   Ext warm without deformities or   obvious joint restrictions , calf tenderness, cyanosis or clubbing  SKIN: warm and dry without lesions    NEURO:  alert, approp, nl sensorium with  no motor or cerebellar deficits apparent.        LDSCT planned        Assessment   COPD GOLD ? / active smoker AB Active smoker - Onset of cough> sob x 2022 -  Labs ordered 08/26/2022  :  allergy screen   alpha one AT phenotype   - 08/26/2022  After extensive coaching inhaler device,  effectiveness = 75% with DPI (no tug at onset of inspiration despite multiple attempts at end exp)  - Labs ordered 08/26/2022  :  allergy screen   alpha one AT phenotype    Classic COPD with AB features > trelegy 100 approp and needs pfts prior to next ov plus approp saba:  Re SABA :  I spent extra time with pt today reviewing appropriate use of albuterol for prn use on exertion with the following points: 1) saba is for relief of sob that does not improve by walking a slower pace or resting but rather if the pt does not improve after trying this first. 2) If the pt is convinced, as many are, that saba helps recover from activity faster then it's easy  to tell if this is the case by re-challenging : ie stop, take the inhaler, then p 5 minutes try the exact same activity (intensity of workload) that just caused the symptoms and see if they are substantially diminished or not after saba 3) if there is an activity that reproducibly causes the symptoms, try the saba 15 min before the activity on alternate days   If in fact the saba really does help, then fine to continue to use it prn but advised may need to look closer at  the maintenance regimen being used to achieve better control of airways disease with exertion.      Chronic respiratory failure with hypoxia (HCC) 06/21/22: Saturation 88 to 89% on room air.  Drops to 85 with mild exertion (walking).  Oxygen saturation 93% on 2 L and 95% on 3 L.> declined 02 due to cost   New guidelines suggest 02 can be prn in this setting and can't afford it anyway so advised stop all smoking and start trelegy (free coupon given) and monitor sats:  Make sure you check your oxygen saturation at your highest level of activity(NOT after you stop)  to be sure it stays over 90% and keep track of it at least once a week, more often if breathing getting worse, and let me know if losing ground. (Collect the dots to connect the dots approach)    Cigarette smoker Counseled re importance of smoking cessation but did not meet time criteria for separate billing           Each maintenance medication was reviewed in detail including emphasizing most importantly the difference between maintenance and prns and under what circumstances the prns are to be triggered using an action plan format where appropriate.  Total time for H and P, chart review, counseling, reviewing hfa/dpi device(s) and generating customized AVS unique to this office visit / same day charting = 45 min new pt eval           Christinia Gully, MD 08/26/2022

## 2022-08-26 ENCOUNTER — Encounter: Payer: Self-pay | Admitting: Internal Medicine

## 2022-08-26 ENCOUNTER — Ambulatory Visit (INDEPENDENT_AMBULATORY_CARE_PROVIDER_SITE_OTHER): Payer: BC Managed Care – PPO | Admitting: Internal Medicine

## 2022-08-26 VITALS — BP 136/86 | HR 88 | Ht 63.5 in | Wt 203.2 lb

## 2022-08-26 DIAGNOSIS — J9611 Chronic respiratory failure with hypoxia: Secondary | ICD-10-CM

## 2022-08-26 DIAGNOSIS — F17210 Nicotine dependence, cigarettes, uncomplicated: Secondary | ICD-10-CM

## 2022-08-26 DIAGNOSIS — J449 Chronic obstructive pulmonary disease, unspecified: Secondary | ICD-10-CM | POA: Diagnosis not present

## 2022-08-26 NOTE — Patient Instructions (Signed)
Plan A = Automatic = Always=    Trelegy [id number] one click each am  Work on inhaler technique:  relax and gently blow all the way out then take brisk full deep breath back in.Hold breath in for at least  5 seconds if you can. Blow out Trelegy put  thru nose. Rinse and gargle with water when done.  If mouth or throat bother you at all,  try brushing teeth/gums/tongue with arm and hammer toothpaste/ make a slurry and gargle and spit out.    Plan B = Backup (to supplement plan A, not to replace it) Only use your albuterol inhaler as a rescue medication to be used if you can't catch your breath by resting or doing a relaxed purse lip breathing pattern.  - The less you use it, the better it will work when you need it. - Ok to use the inhaler up to 2 puffs  every 4 hours if you must but call for appointment if use goes up over your usual need - Don't leave home without it !!  (think of it like the spare tire for your car)    For cough > mucinex dm 1200 mg twice daily as needed   Good luck on your stop date!  Please remember to go to the lab department   for your tests - we will call you with the results when they are available.      Please schedule a follow up visit in 3 months but call sooner if needed pfts

## 2022-08-26 NOTE — Assessment & Plan Note (Signed)
Active smoker - Onset of cough> sob x 2022 -  Labs ordered 08/26/2022  :  allergy screen   alpha one AT phenotype   - 08/26/2022  After extensive coaching inhaler device,  effectiveness = 75% with DPI (no tug at onset of inspiration despite multiple attempts at end exp)  - Labs ordered 08/26/2022  :  allergy screen   alpha one AT phenotype    Classic COPD with AB features > trelegy 100 approp and needs pfts prior to next ov plus approp saba:  Re SABA :  I spent extra time with pt today reviewing appropriate use of albuterol for prn use on exertion with the following points: 1) saba is for relief of sob that does not improve by walking a slower pace or resting but rather if the pt does not improve after trying this first. 2) If the pt is convinced, as many are, that saba helps recover from activity faster then it's easy to tell if this is the case by re-challenging : ie stop, take the inhaler, then p 5 minutes try the exact same activity (intensity of workload) that just caused the symptoms and see if they are substantially diminished or not after saba 3) if there is an activity that reproducibly causes the symptoms, try the saba 15 min before the activity on alternate days   If in fact the saba really does help, then fine to continue to use it prn but advised may need to look closer at the maintenance regimen being used to achieve better control of airways disease with exertion.

## 2022-08-26 NOTE — Assessment & Plan Note (Signed)
Counseled re importance of smoking cessation but did not meet time criteria for separate billing           Each maintenance medication was reviewed in detail including emphasizing most importantly the difference between maintenance and prns and under what circumstances the prns are to be triggered using an action plan format where appropriate.  Total time for H and P, chart review, counseling, reviewing hfa/dpi device(s) and generating customized AVS unique to this office visit / same day charting = 45 min new pt eval

## 2022-08-26 NOTE — Assessment & Plan Note (Signed)
06/21/22: Saturation 88 to 89% on room air.  Drops to 85 with mild exertion (walking).  Oxygen saturation 93% on 2 L and 95% on 3 L.> declined 02 due to cost   New guidelines suggest 02 can be prn in this setting and can't afford it anyway so advised stop all smoking and start trelegy (free coupon given) and monitor sats:  Make sure you check your oxygen saturation at your highest level of activity(NOT after you stop)  to be sure it stays over 90% and keep track of it at least once a week, more often if breathing getting worse, and let me know if losing ground. (Collect the dots to connect the dots approach)

## 2022-08-30 ENCOUNTER — Encounter: Payer: Self-pay | Admitting: *Deleted

## 2022-08-30 LAB — CBC WITH DIFFERENTIAL/PLATELET
Basophils Absolute: 0.1 10*3/uL (ref 0.0–0.2)
Basos: 1 %
EOS (ABSOLUTE): 0.5 10*3/uL — ABNORMAL HIGH (ref 0.0–0.4)
Eos: 7 %
Hematocrit: 45.8 % (ref 34.0–46.6)
Hemoglobin: 15.2 g/dL (ref 11.1–15.9)
Immature Grans (Abs): 0 10*3/uL (ref 0.0–0.1)
Immature Granulocytes: 0 %
Lymphocytes Absolute: 1.9 10*3/uL (ref 0.7–3.1)
Lymphs: 26 %
MCH: 30.8 pg (ref 26.6–33.0)
MCHC: 33.2 g/dL (ref 31.5–35.7)
MCV: 93 fL (ref 79–97)
Monocytes Absolute: 0.5 10*3/uL (ref 0.1–0.9)
Monocytes: 6 %
Neutrophils Absolute: 4.5 10*3/uL (ref 1.4–7.0)
Neutrophils: 60 %
Platelets: 274 10*3/uL (ref 150–450)
RBC: 4.94 x10E6/uL (ref 3.77–5.28)
RDW: 13.8 % (ref 11.7–15.4)
WBC: 7.5 10*3/uL (ref 3.4–10.8)

## 2022-08-30 LAB — ALPHA-1-ANTITRYPSIN PHENOTYP: A-1 Antitrypsin: 166 mg/dL (ref 101–187)

## 2022-08-30 NOTE — Telephone Encounter (Signed)
Patient notified via mychart

## 2022-09-01 NOTE — Progress Notes (Signed)
ATC x1.  Left detailed message per DPR and to call with any questions.

## 2022-09-02 ENCOUNTER — Other Ambulatory Visit: Payer: Self-pay

## 2022-09-02 DIAGNOSIS — F17210 Nicotine dependence, cigarettes, uncomplicated: Secondary | ICD-10-CM

## 2022-09-02 DIAGNOSIS — Z87891 Personal history of nicotine dependence: Secondary | ICD-10-CM

## 2022-09-16 ENCOUNTER — Telehealth: Payer: Self-pay

## 2022-09-16 NOTE — Telephone Encounter (Signed)
Prescription Request  09/16/2022  LOV: Visit date not found  What is the name of the medication or equipment? losartan (COZAAR) 100 MG tablet Fluticasone-Umeclidin-Vilant (TRELEGY ELLIPTA) 100-62.5-25 MCG/ACT AEPB   Have you contacted your pharmacy to request a refill? Yes   Which pharmacy would you like this sent to? Pt had RX at [facility] and wants transferred to [facility]   Patient notified that their request is being sent to the clinical staff for review and that they should receive a response within 2 business days.   Please advise at Mobile [phone] (mobile)

## 2022-09-17 ENCOUNTER — Other Ambulatory Visit: Payer: Self-pay

## 2022-09-17 MED ORDER — TRELEGY ELLIPTA 100-62.5-25 MCG/ACT IN AEPB: 1.0000 | INHALATION_SPRAY | Freq: Every day | RESPIRATORY_TRACT | 1 refills | Status: DC

## 2022-09-17 MED ORDER — LOSARTAN POTASSIUM 100 MG PO TABS: 100.0000 mg | ORAL_TABLET | Freq: Every day | ORAL | 1 refills | Status: DC

## 2022-10-04 ENCOUNTER — Other Ambulatory Visit: Payer: Self-pay | Admitting: Nurse Practitioner

## 2022-10-27 ENCOUNTER — Encounter: Payer: Self-pay | Admitting: Acute Care

## 2022-10-27 ENCOUNTER — Ambulatory Visit (INDEPENDENT_AMBULATORY_CARE_PROVIDER_SITE_OTHER): Payer: BC Managed Care – PPO | Admitting: Acute Care

## 2022-10-27 DIAGNOSIS — F17210 Nicotine dependence, cigarettes, uncomplicated: Secondary | ICD-10-CM

## 2022-10-27 NOTE — Progress Notes (Signed)
Virtual Visit via Telephone Note  I connected with Monique Harmon on 10/27/22 at 11:30 AM EDT by telephone and verified that I am speaking with the correct person using two identifiers.  Location: Patient:  At home Provider:  [address], [place], [place], [address]    I discussed the limitations, risks, security and privacy concerns of performing an evaluation and management service by telephone and the availability of in person appointments. I also discussed with the patient that there may be a patient responsible charge related to this service. The patient expressed understanding and agreed to proceed.   Shared Decision Making Visit Lung Cancer Screening Program [phone])   Eligibility: Age 62 y.o. Pack Years Smoking History Calculation 49 pack year smoking history (# packs/per year x # years smoked) Recent History of coughing up blood  no Unexplained weight loss? no ( >Than 15 pounds within the last 6 months ) Prior History Lung / other cancer no (Diagnosis within the last 5 years already requiring surveillance chest CT Scans). Smoking Status Current Smoker Former Smokers: Years since quit:  NA  Quit Date:  NA  Visit Components: Discussion included one or more decision making aids. yes Discussion included risk/benefits of screening. yes Discussion included potential follow up diagnostic testing for abnormal scans. yes Discussion included meaning and risk of over diagnosis. yes Discussion included meaning and risk of False Positives. yes Discussion included meaning of total radiation exposure. yes  Counseling Included: Importance of adherence to annual lung cancer LDCT screening. yes Impact of comorbidities on ability to participate in the program. yes Ability and willingness to under diagnostic treatment. yes  Smoking Cessation Counseling: Current Smokers:  Discussed importance of smoking cessation. yes Information about tobacco cessation classes and interventions  provided to patient. yes Patient provided with "ticket" for LDCT Scan. yes Symptomatic Patient. no  Counseling NA Diagnosis Code: Tobacco Use Z72.0 Asymptomatic Patient yes  Counseling (Intermediate counseling: > three minutes counseling) U0454 Former Smokers:  Discussed the importance of maintaining cigarette abstinence. yes Diagnosis Code: Personal History of Nicotine Dependence. U98.119 Information about tobacco cessation classes and interventions provided to patient. Yes Patient provided with "ticket" for LDCT Scan. yes Written Order for Lung Cancer Screening with LDCT placed in Epic. Yes (CT Chest Lung Cancer Screening Low Dose W/O CM) [id number] Z12.2-Screening of respiratory organs Z87.891-Personal history of nicotine dependence  I have spent 25 minutes of face to face/ virtual visit   time with  Monique Harmon discussing the risks and benefits of lung cancer screening. We viewed / discussed a power point together that explained in detail the above noted topics. We paused at intervals to allow for questions to be asked and answered to ensure understanding.We discussed that the single most powerful action that she can take to decrease her risk of developing lung cancer is to quit smoking. We discussed whether or not she is ready to commit to setting a quit date. We discussed options for tools to aid in quitting smoking including nicotine replacement therapy, non-nicotine medications, support groups, Quit Smart classes, and behavior modification. We discussed that often times setting smaller, more achievable goals, such as eliminating 1 cigarette a day for a week and then 2 cigarettes a day for a week can be helpful in slowly decreasing the number of cigarettes smoked. This allows for a sense of accomplishment as well as providing a clinical benefit. I provided  her  with smoking cessation  information  with contact information for community resources, classes, free  nicotine replacement therapy, and  access to mobile apps, text messaging, and on-line smoking cessation help. I have also provided  her  the office contact information in the event she needs to contact me, or the screening staff. We discussed the time and location of the scan, and that either Abigail Miyamoto RN, Karlton Lemon, RN  or I will call / send a letter with the results within 24-72 hours of receiving them. The patient verbalized understanding of all of  the above and had no further questions upon leaving the office. They have my contact information in the event they have any further questions.  I spent 4 minutes counseling on smoking cessation and the health risks of continued tobacco abuse.  I explained to the patient that there has been a high incidence of coronary artery disease noted on these exams. I explained that this is a non-gated exam therefore degree or severity cannot be determined. This patient is on statin therapy. I have asked the patient to follow-up with their PCP regarding any incidental finding of coronary artery disease and management with diet or medication as their PCP  feels is clinically indicated. The patient verbalized understanding of the above and had no further questions upon completion of the visit.      Bevelyn Ngo, NP 10/27/2022

## 2022-10-27 NOTE — Patient Instructions (Signed)
Thank you for participating in the [facility] Scre[facility]. It was our pleasure to meet you today. We will call you with the results of your scan within the next few days. Your scan will be assigned a Lung RADS category score by the physicians reading the scans.  This Lung RADS score determines follow up scanning.  See below for description of categories, and follow up screening recommendations. We will be in touch to schedule your follow up screening annually or based on recommendations of our providers. We will fax a copy of your scan results to your Primary Care Physician, or the physician who referred you to the program, to ensure they have the results. Please call the office if you have any questions or concerns regarding your scanning experience or results.  Our office number is [phone]. Please speak with Denise Phelps, RN. , or  Denise Buckner RN, They are  our Lung Cancer Screening RN.'s If They are unavailable when you call, Please leave a message on the voice mail. We will return your call at our earliest convenience.This voice mail is monitored several times a day.  Remember, if your scan is normal, we will scan you annually as long as you continue to meet the criteria for the program. (Age 50-80, Current smoker or smoker who has quit within the last 15 years). If you are a smoker, remember, quitting is the single most powerful action that you can take to decrease your risk of lung cancer and other pulmonary, breathing related problems. We know quitting is hard, and we are here to help.  Please let us know if there is anything we can do to help you meet your goal of quitting. If you are a former smoker, congratulations. We are proud of you! Remain smoke free! Remember you can refer friends or family members through the number above.  We will screen them to make sure they meet criteria for the program. Thank you for helping us take better care of you by  participating in Lung Screening.  You can receive free nicotine replacement therapy ( patches, gum or mints) by calling 1-800-QUIT NOW. Please call so we can get you on the path to becoming  a non-smoker. I know it is hard, but you can do this!  Lung RADS Categories:  Lung RADS 1: no nodules or definitely non-concerning nodules.  Recommendation is for a repeat annual scan in 12 months.  Lung RADS 2:  nodules that are non-concerning in appearance and behavior with a very low likelihood of becoming an active cancer. Recommendation is for a repeat annual scan in 12 months.  Lung RADS 3: nodules that are probably non-concerning , includes nodules with a low likelihood of becoming an active cancer.  Recommendation is for a 6-month repeat screening scan. Often noted after an upper respiratory illness. We will be in touch to make sure you have no questions, and to schedule your 6-month scan.  Lung RADS 4 A: nodules with concerning findings, recommendation is most often for a follow up scan in 3 months or additional testing based on our provider's assessment of the scan. We will be in touch to make sure you have no questions and to schedule the recommended 3 month follow up scan.  Lung RADS 4 B:  indicates findings that are concerning. We will be in touch with you to schedule additional diagnostic testing based on our provider's  assessment of the scan.  Other options for assistance in smoking cessation (   As covered by your insurance benefits)  Hypnosis for smoking cessation  [employer] [phone]  Acupuncture for smoking cessation  [facility] [phone]   

## 2022-10-29 ENCOUNTER — Ambulatory Visit (HOSPITAL_COMMUNITY): Payer: BC Managed Care – PPO

## 2022-11-04 ENCOUNTER — Ambulatory Visit
Admission: RE | Admit: 2022-11-04 | Discharge: 2022-11-04 | Disposition: A | Discharge status: Home / Self Care | Payer: BC Managed Care – PPO | Source: Ambulatory Visit | Attending: Acute Care | Admitting: Acute Care

## 2022-11-04 DIAGNOSIS — F17210 Nicotine dependence, cigarettes, uncomplicated: Secondary | ICD-10-CM | POA: Insufficient documentation

## 2022-11-04 DIAGNOSIS — Z87891 Personal history of nicotine dependence: Secondary | ICD-10-CM | POA: Diagnosis present

## 2022-11-11 ENCOUNTER — Other Ambulatory Visit: Payer: Self-pay | Admitting: Acute Care

## 2022-11-11 DIAGNOSIS — F17210 Nicotine dependence, cigarettes, uncomplicated: Secondary | ICD-10-CM

## 2022-11-11 DIAGNOSIS — Z122 Encounter for screening for malignant neoplasm of respiratory organs: Secondary | ICD-10-CM

## 2022-11-11 DIAGNOSIS — Z87891 Personal history of nicotine dependence: Secondary | ICD-10-CM

## 2022-11-22 ENCOUNTER — Other Ambulatory Visit: Payer: Self-pay | Admitting: Family Medicine

## 2022-11-24 ENCOUNTER — Telehealth: Payer: Self-pay | Admitting: Family Medicine

## 2022-11-24 NOTE — Telephone Encounter (Signed)
Patient would like labs done before appointment on 7/3

## 2022-11-25 ENCOUNTER — Other Ambulatory Visit: Payer: Self-pay

## 2022-11-25 DIAGNOSIS — I10 Essential (primary) hypertension: Secondary | ICD-10-CM

## 2022-11-25 DIAGNOSIS — R739 Hyperglycemia, unspecified: Secondary | ICD-10-CM

## 2022-11-25 DIAGNOSIS — E785 Hyperlipidemia, unspecified: Secondary | ICD-10-CM

## 2022-11-25 NOTE — Telephone Encounter (Signed)
Patient informed per drs orders  

## 2022-11-26 ENCOUNTER — Ambulatory Visit (INDEPENDENT_AMBULATORY_CARE_PROVIDER_SITE_OTHER): Payer: BC Managed Care – PPO | Admitting: Internal Medicine

## 2022-11-26 ENCOUNTER — Encounter: Payer: Self-pay | Admitting: Internal Medicine

## 2022-11-26 VITALS — BP 145/72 | HR 85 | Ht 64.0 in | Wt 204.2 lb

## 2022-11-26 DIAGNOSIS — J9611 Chronic respiratory failure with hypoxia: Secondary | ICD-10-CM

## 2022-11-26 DIAGNOSIS — F17210 Nicotine dependence, cigarettes, uncomplicated: Secondary | ICD-10-CM

## 2022-11-26 DIAGNOSIS — I2510 Atherosclerotic heart disease of native coronary artery without angina pectoris: Secondary | ICD-10-CM | POA: Insufficient documentation

## 2022-11-26 DIAGNOSIS — J449 Chronic obstructive pulmonary disease, unspecified: Secondary | ICD-10-CM

## 2022-11-26 NOTE — Assessment & Plan Note (Signed)
Active smoker/MM - Onset of cough> sob x 2022 -  Labs ordered 08/26/2022  :  allergy screen   alpha one AT phenotype   - 08/26/2022  After extensive coaching inhaler device,  effectiveness = 75% with DPI (no tug at onset of inspiration despite multiple attempts at end exp)  - Labs ordered 08/26/2022  :  allergy screen  Eos 0.5   alpha one AT phenotype  MM  level 166  - LDCT 6/6/24vvMild diffuse bronchial wall thickening with mild centrilobular and paraseptal emphysema;   Group D (now reclassified as E) in terms of symptom/risk and laba/lama/ICS  therefore appropriate rx at this point >>>  continue trelegy 100 with approp saba

## 2022-11-26 NOTE — Assessment & Plan Note (Signed)
Referred to Cardiology 11/26/2022 >>>   Discussed in detail all the  indications, usual  risks and alternatives  relative to the benefits with patient who agrees to proceed with w/u as outlined.     Each maintenance medication was reviewed in detail including emphasizing most importantly the difference between maintenance and prns and under what circumstances the prns are to be triggered using an action plan format where appropriate.  Total time for H and P, chart review, counseling, reviewing dpi/hfa device(s) , directly observing portions of ambulatory 02 saturation study/ and generating customized AVS unique to this office visit / same day charting > 30 min

## 2022-11-26 NOTE — Assessment & Plan Note (Signed)
Counseled re importance of smoking cessation but did not meet time criteria for separate billing   °

## 2022-11-26 NOTE — Patient Instructions (Addendum)
My office will be contacting you by phone for referral to cardiology at [facility] re your artery  - if you don't hear back from my office within one week please call us back or notify us thru MyChart and we'll address it right away.   Continue trelegy 100 each am   The key is to stop smoking completely before smoking completely stops you!   Please schedule a follow up visit in 6  months but call sooner if needed

## 2022-11-26 NOTE — Progress Notes (Signed)
Monique Harmon, female    DOB: 14-Jan-1961    MRN: [id number]   Brief patient profile:  1 yowf active smoker/elementary teacher assistant  referred to pulmonary clinic in [place]  08/26/2022 by Cherlyn Cushing  for copd eval with onset around 2020 much worse since Nov 2023 better on Trelegy but could not afford     History of Present Illness  08/26/2022  Pulmonary/ 1st office eval/ Marianela Mandrell / [place] [facility] on Advair 100 one daily  Chief Complaint  Patient presents with   Consult    Suspected COPD  Was prescribed O2 but cannot afford   Dyspnea:   back before breathing but one flight of steps esp if carrying groceries  Cough: cough / congestion worse first thing in am's then continues all day thick white s/p recent augmentin rx Sleep: flat bed / on L side preferably, one pillow no resp  SABA use: maybe once a day  02: none  Lung cancer screen: Dr Adriana Simas already referred to [facility] clinic Rec Plan A = Automatic = Always=    Trelegy 100 one click each am Work on inhaler technique:   Plan B = Backup (to supplement plan A, not to replace it) Only use your albuterol inhaler as a rescue medication  For cough > mucinex dm 1200 mg twice daily as needed  Good luck on your stop date!  allergy screen  Eos 0.5   alpha one AT phenotype  MM  level 166       Please schedule a follow up visit in 3 months but call sooner if needed pfts   11/26/2022  f/u ov/[place] office/Trusten Hume re:  COPD GOLD ?  maint on trelegy   Chief Complaint  Patient presents with   Follow-up   Dyspnea:  stairs are still a problem but now  back / knees limit more than breathing Cough: minimal am sputum, sometimes darker depending on how much smoking day prior  Sleeping: flat bed on side / one pillow SABA use: none  02: none and not monitoring    Lung cancer screening: q June    No obvious day to day or daytime variability or assoc  mucus plugs or hemoptysis or cp or chest tightness, subjective wheeze or overt sinus or hb  symptoms.   Sleeping  without nocturnal  or early am exacerbation  of respiratory  c/o's or need for noct saba. Also denies any obvious fluctuation of symptoms with weather or environmental changes or other aggravating or alleviating factors except as outlined above   No unusual exposure hx or h/o childhood pna/ asthma or knowledge of premature birth.  Current Allergies, Complete Past Medical History, Past Surgical History, Family History, and Social History were reviewed in [employer] record.  ROS  The following are not active complaints unless bolded Hoarseness, sore throat, dysphagia, dental problems, itching, sneezing,  nasal congestion or discharge of excess mucus or purulent secretions, ear ache,   fever, chills, sweats, unintended wt loss or wt gain, classically pleuritic or exertional cp,  orthopnea pnd or arm/hand swelling  or leg swelling, presyncope, palpitations, abdominal pain, anorexia, nausea, vomiting, diarrhea  or change in bowel habits or change in bladder habits, change in stools or change in urine, dysuria, hematuria,  rash, arthralgias, visual complaints, headache, numbness, weakness or ataxia or problems with walking or coordination,  change in mood or  memory.        Current Meds  Medication Sig   albuterol (VENTOLIN HFA) 108 (  90 Base) MCG/ACT inhaler Inhale 1-2 puffs into the lungs every 6 (six) hours as needed for wheezing or shortness of breath.   atorvastatin (LIPITOR) 20 MG tablet Take 1 tablet by mouth once daily   cetirizine (ZYRTEC) 10 MG tablet Take 1 tablet (10 mg total) by mouth daily.   diphenhydrAMINE (BENADRYL) 25 MG tablet Take 25 mg by mouth at bedtime.   losartan (COZAAR) 100 MG tablet Take 1 tablet (100 mg total) by mouth daily.   metoprolol tartrate (LOPRESSOR) 50 MG tablet Take 0.5 tablets (25 mg total) by mouth 2 (two) times daily. (Patient taking differently: Take 25 mg by mouth daily.)   Probiotic Product (PROBIOTIC DAILY PO)  Take by mouth. One daily   TRELEGY ELLIPTA 100-62.5-25 MCG/ACT AEPB INHALE 1 PUFF INTO LUNGS ONCE DAILY             Past Medical History:  Diagnosis Date   Complication of anesthesia    Depression    Hyperlipemia    Hypertension       Objective:     11/26/2022       204   08/26/22 203 lb 3.2 oz (92.2 kg)  08/04/22 204 lb (92.5 kg)  07/26/22 197 lb (89.4 kg)     Vital signs reviewed  11/26/2022  - Note at rest 02 sats  94% on RA   General appearance:    pleasant mod obese wf nad   HEENT :  Oropharynx  clear      NECK :  without JVD/Nodes/TM/ nl carotid upstrokes bilaterally   LUNGS: no acc muscle use,  Mod barrel  contour chest wall with bilateral  Distant bs s audible wheeze and  without cough on insp or exp maneuvers and mod  Hyperresonant  to  percussion bilaterally     CV:  RRR  no s3 or murmur or increase in P2, and no edema   ABD:  obese soft and nontender   MS:   Ext warm without deformities or   obvious joint restrictions , calf tenderness, cyanosis or clubbing  SKIN: warm and dry without lesions    NEURO:  alert, approp, nl sensorium with  no motor or cerebellar deficits apparent.          I personally reviewed images and agree with radiology impression as follows:   Chest LDCT    11/04/22  1. Lung-RADS 1S, negative. Continue annual screening with low-dose chest CT without contrast in 12 months. 2. The "S" modifier above refers to potentially clinically significant non lung cancer related findings. Specifically, there is aortic atherosclerosis, in addition to left main and left anterior descending coronary artery disease.   3. Mild diffuse bronchial wall thickening with mild centrilobular and paraseptal emphysema; imaging findings suggestive of underlying COPD.   Aortic Atherosclerosis (ICD10-I70.0) and Emphysema (ICD10-J43.9).            Assessment

## 2022-12-01 ENCOUNTER — Ambulatory Visit: Payer: BC Managed Care – PPO | Admitting: Family Medicine

## 2022-12-01 ENCOUNTER — Encounter: Payer: Self-pay | Admitting: Family Medicine

## 2022-12-01 VITALS — BP 114/60 | HR 71 | Temp 97.2°F | Ht 64.0 in | Wt 205.0 lb

## 2022-12-01 DIAGNOSIS — J449 Chronic obstructive pulmonary disease, unspecified: Secondary | ICD-10-CM | POA: Diagnosis not present

## 2022-12-01 DIAGNOSIS — I10 Essential (primary) hypertension: Secondary | ICD-10-CM | POA: Diagnosis not present

## 2022-12-01 DIAGNOSIS — M25561 Pain in right knee: Secondary | ICD-10-CM

## 2022-12-01 DIAGNOSIS — E785 Hyperlipidemia, unspecified: Secondary | ICD-10-CM | POA: Diagnosis not present

## 2022-12-01 DIAGNOSIS — M25569 Pain in unspecified knee: Secondary | ICD-10-CM | POA: Insufficient documentation

## 2022-12-01 DIAGNOSIS — M25562 Pain in left knee: Secondary | ICD-10-CM

## 2022-12-01 LAB — CBC WITH DIFFERENTIAL/PLATELET
Basophils Absolute: 0.1 10*3/uL (ref 0.0–0.2)
Basos: 1 %
EOS (ABSOLUTE): 0.3 10*3/uL (ref 0.0–0.4)
Eos: 4 %
Hematocrit: 45.8 % (ref 34.0–46.6)
Hemoglobin: 15.7 g/dL (ref 11.1–15.9)
Immature Grans (Abs): 0 10*3/uL (ref 0.0–0.1)
Immature Granulocytes: 0 %
Lymphocytes Absolute: 2.9 10*3/uL (ref 0.7–3.1)
Lymphs: 32 %
MCH: 31.9 pg (ref 26.6–33.0)
MCHC: 34.3 g/dL (ref 31.5–35.7)
MCV: 93 fL (ref 79–97)
Monocytes Absolute: 0.6 10*3/uL (ref 0.1–0.9)
Monocytes: 7 %
Neutrophils Absolute: 5.2 10*3/uL (ref 1.4–7.0)
Neutrophils: 56 %
Platelets: 240 10*3/uL (ref 150–450)
RBC: 4.92 x10E6/uL (ref 3.77–5.28)
RDW: 13.3 % (ref 11.7–15.4)
WBC: 9.1 10*3/uL (ref 3.4–10.8)

## 2022-12-01 LAB — COMPREHENSIVE METABOLIC PANEL
ALT: 19 IU/L (ref 0–32)
AST: 17 IU/L (ref 0–40)
Albumin: 4.6 g/dL (ref 3.9–4.9)
Alkaline Phosphatase: 91 IU/L (ref 44–121)
BUN/Creatinine Ratio: 19 (ref 12–28)
BUN: 12 mg/dL (ref 8–27)
Bilirubin Total: 0.4 mg/dL (ref 0.0–1.2)
CO2: 28 mmol/L (ref 20–29)
Calcium: 9.5 mg/dL (ref 8.7–10.3)
Chloride: 94 mmol/L — ABNORMAL LOW (ref 96–106)
Creatinine, Ser: 0.63 mg/dL (ref 0.57–1.00)
Globulin, Total: 2.4 g/dL (ref 1.5–4.5)
Glucose: 111 mg/dL — ABNORMAL HIGH (ref 70–99)
Potassium: 5.3 mmol/L — ABNORMAL HIGH (ref 3.5–5.2)
Sodium: 136 mmol/L (ref 134–144)
Total Protein: 7 g/dL (ref 6.0–8.5)
eGFR: 100 mL/min/{1.73_m2} (ref >59)

## 2022-12-01 LAB — LIPID PANEL
Chol/HDL Ratio: 2.6 ratio (ref 0.0–4.4)
Cholesterol, Total: 195 mg/dL (ref 100–199)
HDL: 75 mg/dL (ref >39)
LDL Chol Calc (NIH): 96 mg/dL (ref 0–99)
Triglycerides: 138 mg/dL (ref 0–149)
VLDL Cholesterol Cal: 24 mg/dL (ref 5–40)

## 2022-12-01 LAB — HEMOGLOBIN A1C
Est. average glucose Bld gHb Est-mCnc: 123 mg/dL
Hgb A1c MFr Bld: 5.9 % — ABNORMAL HIGH (ref 4.8–5.6)

## 2022-12-01 LAB — MICROALBUMIN / CREATININE URINE RATIO
Creatinine, Urine: 107.1 mg/dL
Microalb/Creat Ratio: 57 mg/g creat — ABNORMAL HIGH (ref 0–29)
Microalbumin, Urine: 60.8 ug/mL

## 2022-12-01 MED ORDER — ATORVASTATIN CALCIUM 40 MG PO TABS: 40.0000 mg | ORAL_TABLET | Freq: Every day | ORAL | 3 refills | Status: DC

## 2022-12-01 MED ORDER — VARENICLINE TARTRATE 1 MG PO TABS: 1.0000 mg | ORAL_TABLET | Freq: Two times a day (BID) | ORAL | 3 refills | Status: DC

## 2022-12-01 NOTE — Assessment & Plan Note (Signed)
Stable at this time.  Needs to quit smoking.  Chantix refilled.

## 2022-12-01 NOTE — Patient Instructions (Signed)
Medications as prescribed.  Follow up in 6 months.  Take care  Dr. Jonluke Cobbins  

## 2022-12-01 NOTE — Assessment & Plan Note (Signed)
Stable on losartan and metoprolol.  Of note, recent urine microalbumin just returned and was mildly elevated.  Will repeat at follow-up.

## 2022-12-01 NOTE — Assessment & Plan Note (Signed)
Patient does not want any intervention at this time.  Letter was written so that patient can be accommodated with a first-floor apartment.

## 2022-12-01 NOTE — Progress Notes (Signed)
Subjective:  Patient ID: Monique Harmon, female    DOB: 31-Oct-1960  Age: 62 y.o. MRN: [id number]  CC: Chief Complaint  Patient presents with   6 month follow up    hyperlipdemia   knee pain and weakness    Right knee going out and locking up - needing a note for a downstairs apartment    HPI:  62 year old female with COPD, coronary artery disease per CT findings, hypertension, tobacco abuse, hyperlipidemia presents for follow-up.  Patient reports that she is having issues with her knees, particular the right knee.  She states that at times it locks up and she has difficulty straightening it out.  She states that this happens once every 2 to 3 weeks.  Her primary concern is going up and down steps and related to her apartment.  She is requesting a letter that we will allow her to have the ability to get a first-floor apartment.  Patient states that she cannot afford oxygen.  She is on Trelegy regarding her COPD.  Follows with pulmonology.  Pulmonology has arranged cardiology visit given CT findings which showed coronary artery disease.  Patient denies any chest pain.  Patient continues to smoke.  She states that she did well on Chantix and had a significant improvement in her tobacco use.  However, she reports that he did not have any refills and therefore she just stopped.  She would like to resume Chantix.  Hypertension is well-controlled on losartan and metoprolol.  Patient Active Problem List   Diagnosis Date Noted   Knee pain 12/01/2022   Coronary artery calcification seen on CT scan 11/26/2022   Chronic respiratory failure with hypoxia (HCC) 06/21/2022   Irritable bowel syndrome with both constipation and diarrhea 04/08/2022   Primary hypertension 03/16/2022   Hyperlipidemia 03/16/2022   COPD GOLD ? / active smoker AB 03/16/2022   Cigarette smoker 03/16/2022    Social Hx   Social History   Socioeconomic History   Marital status: Single    Spouse name: Not on file    Number of children: Not on file   Years of education: Not on file   Highest education level: Not on file  Occupational History   Not on file  Tobacco Use   Smoking status: Every Day    Packs/day: 1.00    Years: 49.00    Additional pack years: 0.00    Total pack years: 49.00    Types: Cigarettes    Passive exposure: Current   Smokeless tobacco: Never   Tobacco comments:    Recently tried Chantix for the third time and failed.   Vaping Use   Vaping Use: Never used  Substance and Sexual Activity   Alcohol use: Yes    Alcohol/week: 28.0 standard drinks of alcohol    Types: 28 Cans of beer per week    Comment: drinks 4 beers every day   Drug use: Not Currently   Sexual activity: Not Currently    Birth control/protection: Post-menopausal, Surgical  Other Topics Concern   Not on file  Social History Narrative   Not on file   Social Determinants of Health   Financial Resource Strain: Not on file  Food Insecurity: Not on file  Transportation Needs: Not on file  Physical Activity: Not on file  Stress: Not on file  Social Connections: Not on file    Review of Systems Per HPI  Objective:  BP 114/60   Pulse 71   Temp (!) 97.2  F (36.2 C)   Ht 5\' 4"  (1.626 m)   Wt 205 lb (93 kg)   SpO2 96%   BMI 35.19 kg/m      12/01/2022   10:23 AM 11/26/2022   11:38 AM 08/26/2022    8:34 AM  BP/Weight  Systolic BP 114 145 136  Diastolic BP 60 72 86  Wt. (Lbs) 205 204.2 203.2  BMI 35.19 kg/m2 35.05 kg/m2 35.43 kg/m2    Physical Exam Vitals and nursing note reviewed.  Constitutional:      General: She is not in acute distress.    Appearance: Normal appearance.  HENT:     Head: Normocephalic and atraumatic.  Eyes:     General:        Right eye: No discharge.        Left eye: No discharge.     Conjunctiva/sclera: Conjunctivae normal.  Cardiovascular:     Rate and Rhythm: Normal rate and regular rhythm.  Pulmonary:     Effort: Pulmonary effort is normal.     Breath  sounds: Normal breath sounds. No wheezing or rales.  Neurological:     Mental Status: She is alert.  Psychiatric:        Mood and Affect: Mood normal.        Behavior: Behavior normal.     Lab Results  Component Value Date   WBC 9.1 11/30/2022   HGB 15.7 11/30/2022   HCT 45.8 11/30/2022   PLT 240 11/30/2022   GLUCOSE 111 (H) 11/30/2022   CHOL 195 11/30/2022   TRIG 138 11/30/2022   HDL 75 11/30/2022   LDLCALC 96 11/30/2022   ALT 19 11/30/2022   AST 17 11/30/2022   NA 136 11/30/2022   K 5.3 (H) 11/30/2022   CL 94 (L) 11/30/2022   CREATININE 0.63 11/30/2022   BUN 12 11/30/2022   CO2 28 11/30/2022   HGBA1C 5.9 (H) 11/30/2022     Assessment & Plan:   Problem List Items Addressed This Visit       Cardiovascular and Mediastinum   Primary hypertension    Stable on losartan and metoprolol.  Of note, recent urine microalbumin just returned and was mildly elevated.  Will repeat at follow-up.      Relevant Medications   atorvastatin (LIPITOR) 40 MG tablet     Respiratory   COPD GOLD ? / active smoker AB    Stable at this time.  Needs to quit smoking.  Chantix refilled.      Relevant Medications   varenicline (CHANTIX) 1 MG tablet     Other   Hyperlipidemia    Given her CT findings, LDL goal less than 70.  Increasing atorvastatin.      Relevant Medications   atorvastatin (LIPITOR) 40 MG tablet   Knee pain - Primary    Patient does not want any intervention at this time.  Letter was written so that patient can be accommodated with a first-floor apartment.       Meds ordered this encounter  Medications   varenicline (CHANTIX) 1 MG tablet    Sig: Take 1 tablet (1 mg total) by mouth 2 (two) times daily.    Dispense:  180 tablet    Refill:  3   atorvastatin (LIPITOR) 40 MG tablet    Sig: Take 1 tablet (40 mg total) by mouth daily.    Dispense:  90 tablet    Refill:  3    Follow-up:  6 months  Rowena Moilanen DO  [place]

## 2022-12-01 NOTE — Assessment & Plan Note (Signed)
Given her CT findings, LDL goal less than 70.  Increasing atorvastatin.

## 2023-01-30 DIAGNOSIS — Z419 Encounter for procedure for purposes other than remedying health state, unspecified: Secondary | ICD-10-CM | POA: Diagnosis not present

## 2023-03-01 DIAGNOSIS — Z419 Encounter for procedure for purposes other than remedying health state, unspecified: Secondary | ICD-10-CM | POA: Diagnosis not present

## 2023-03-05 ENCOUNTER — Other Ambulatory Visit: Payer: Self-pay | Admitting: Family Medicine

## 2023-03-05 ENCOUNTER — Other Ambulatory Visit: Payer: Self-pay | Admitting: Nurse Practitioner

## 2023-03-09 ENCOUNTER — Telehealth: Payer: Self-pay | Admitting: Internal Medicine

## 2023-03-09 NOTE — Telephone Encounter (Signed)
Called and spoke with patient to discuss scheduling her 6 month follow up with Dr. Sherene Sires.  She stated she no longer has the [facility], but has [facility].  I informed her were not in network with that plan and she states she cannot afford to come back to see Dr. Sherene Sires

## 2023-04-01 DIAGNOSIS — Z419 Encounter for procedure for purposes other than remedying health state, unspecified: Secondary | ICD-10-CM | POA: Diagnosis not present

## 2023-04-05 ENCOUNTER — Ambulatory Visit: Payer: Medicaid Other | Admitting: Family Medicine

## 2023-04-05 VITALS — BP 129/78 | HR 71 | Ht 64.0 in | Wt 209.0 lb

## 2023-04-05 DIAGNOSIS — E785 Hyperlipidemia, unspecified: Secondary | ICD-10-CM

## 2023-04-05 DIAGNOSIS — I10 Essential (primary) hypertension: Secondary | ICD-10-CM

## 2023-04-05 DIAGNOSIS — J449 Chronic obstructive pulmonary disease, unspecified: Secondary | ICD-10-CM

## 2023-04-05 MED ORDER — VARENICLINE TARTRATE 1 MG PO TABS: 1.0000 mg | ORAL_TABLET | Freq: Two times a day (BID) | ORAL | 1 refills | Status: DC

## 2023-04-05 MED ORDER — TRELEGY ELLIPTA 100-62.5-25 MCG/ACT IN AEPB: 1.0000 | INHALATION_SPRAY | Freq: Every day | RESPIRATORY_TRACT | 6 refills | Status: DC

## 2023-04-05 NOTE — Patient Instructions (Signed)
Continue your medications.  Labs at follow up in 3 months.  Take care  Dr. Adriana Simas

## 2023-04-05 NOTE — Assessment & Plan Note (Signed)
Plan for lipid panel at follow-up.  Continue statin.

## 2023-04-05 NOTE — Assessment & Plan Note (Signed)
 Stable.  Continue metoprolol and losartan.

## 2023-04-05 NOTE — Assessment & Plan Note (Signed)
Needs to quit smoking.  Chantix refilled.  Trelegy refilled.  May need to switch if cannot get approved.

## 2023-04-05 NOTE — Progress Notes (Signed)
Subjective:  Patient ID: Monique Harmon, female    DOB: [birth year]  Age: 62 y.o. MRN: [id number]  CC:   Chief Complaint  Patient presents with   COPD    HPI:  62 year old female presents for follow-up.  COPD stable at this time.  She is on Trelegy.  She now has Medicaid.  Will see if we can get this approved.  It is not preferred on formulary.  Hypertension stable on metoprolol and losartan.  Last LDL was 96.  She is on atorvastatin.  Will need labs at follow-up.  Still smoking.  She has cut back significantly.  Needs to quit.  Desires refill of Chantix.  Patient overdue for vaccines.  Declines today.  Patient Active Problem List   Diagnosis Date Noted   Knee pain 12/01/2022   Coronary artery calcification seen on CT scan 11/26/2022   Chronic respiratory failure with hypoxia (HCC) 06/21/2022   Irritable bowel syndrome with both constipation and diarrhea 04/08/2022   Primary hypertension 03/16/2022   Hyperlipidemia 03/16/2022   COPD GOLD ? / active smoker AB 03/16/2022   Cigarette smoker 03/16/2022    Social Hx   Social History   Socioeconomic History   Marital status: Single    Spouse name: Not on file   Number of children: Not on file   Years of education: Not on file   Highest education level: Not on file  Occupational History   Not on file  Tobacco Use   Smoking status: Every Day    Current packs/day: 1.00    Average packs/day: 1 pack/day for 49.0 years (49.0 ttl pk-yrs)    Types: Cigarettes    Passive exposure: Current   Smokeless tobacco: Never   Tobacco comments:    Recently tried Chantix for the third time and failed.   Vaping Use   Vaping status: Never Used  Substance and Sexual Activity   Alcohol use: Yes    Alcohol/week: 28.0 standard drinks of alcohol    Types: 28 Cans of beer per week    Comment: drinks 4 beers every day   Drug use: Not Currently   Sexual activity: Not Currently    Birth control/protection: Post-menopausal, Surgical  Other  Topics Concern   Not on file  Social History Narrative   Not on file   Social Determinants of Health   Financial Resource Strain: Not on file  Food Insecurity: Not on file  Transportation Needs: Not on file  Physical Activity: Not on file  Stress: Not on file  Social Connections: Not on file    Review of Systems  Constitutional: Negative.   Respiratory:  Positive for shortness of breath.     Objective:  BP 129/78   Pulse 71   Ht 5\' 4"  (1.626 m)   Wt 209 lb (94.8 kg)   SpO2 93%   BMI 35.87 kg/m      04/05/2023    1:53 PM 12/01/2022   10:23 AM 11/26/2022   11:38 AM  BP/Weight  Systolic BP 129 114 145  Diastolic BP 78 60 72  Wt. (Lbs) 209 205 204.2  BMI 35.87 kg/m2 35.19 kg/m2 35.05 kg/m2    Physical Exam Vitals and nursing note reviewed.  Constitutional:      General: She is not in acute distress.    Appearance: Normal appearance.  HENT:     Head: Normocephalic and atraumatic.  Eyes:     General:        Right eye: No discharge.  Left eye: No discharge.     Conjunctiva/sclera: Conjunctivae normal.  Cardiovascular:     Rate and Rhythm: Normal rate and regular rhythm.  Pulmonary:     Effort: Pulmonary effort is normal.     Breath sounds: Normal breath sounds. No wheezing or rales.  Neurological:     Mental Status: She is alert.     Lab Results  Component Value Date   WBC 9.1 11/30/2022   HGB 15.7 11/30/2022   HCT 45.8 11/30/2022   PLT 240 11/30/2022   GLUCOSE 111 (H) 11/30/2022   CHOL 195 11/30/2022   TRIG 138 11/30/2022   HDL 75 11/30/2022   LDLCALC 96 11/30/2022   ALT 19 11/30/2022   AST 17 11/30/2022   NA 136 11/30/2022   K 5.3 (H) 11/30/2022   CL 94 (L) 11/30/2022   CREATININE 0.63 11/30/2022   BUN 12 11/30/2022   CO2 28 11/30/2022   HGBA1C 5.9 (H) 11/30/2022     Assessment & Plan:   Problem List Items Addressed This Visit       Cardiovascular and Mediastinum   Primary hypertension - Primary    Stable.  Continue metoprolol  and losartan.        Respiratory   COPD GOLD ? / active smoker AB    Needs to quit smoking.  Chantix refilled.  Trelegy refilled.  May need to switch if cannot get approved.      Relevant Medications   varenicline (CHANTIX) 1 MG tablet   Fluticasone-Umeclidin-Vilant (TRELEGY ELLIPTA) 100-62.5-25 MCG/ACT AEPB     Other   Hyperlipidemia    Plan for lipid panel at follow-up.  Continue statin.       Meds ordered this encounter  Medications   varenicline (CHANTIX) 1 MG tablet    Sig: Take 1 tablet (1 mg total) by mouth 2 (two) times daily.    Dispense:  180 tablet    Refill:  1   Fluticasone-Umeclidin-Vilant (TRELEGY ELLIPTA) 100-62.5-25 MCG/ACT AEPB    Sig: Inhale 1 Inhalation into the lungs daily.    Dispense:  60 each    Refill:  6    Follow-up:  Return in about 3 months (around 07/06/2023) for Follow up Chronic medical issues.  Everlene Other DO [facility]

## 2023-04-18 ENCOUNTER — Other Ambulatory Visit: Payer: Self-pay | Admitting: Family Medicine

## 2023-04-18 MED ORDER — MOMETASONE FURO-FORMOTEROL FUM 200-5 MCG/ACT IN AERO: 2.0000 | INHALATION_SPRAY | Freq: Two times a day (BID) | RESPIRATORY_TRACT | 6 refills | Status: DC

## 2023-05-01 DIAGNOSIS — Z419 Encounter for procedure for purposes other than remedying health state, unspecified: Secondary | ICD-10-CM | POA: Diagnosis not present

## 2023-06-01 DIAGNOSIS — Z419 Encounter for procedure for purposes other than remedying health state, unspecified: Secondary | ICD-10-CM | POA: Diagnosis not present

## 2023-06-27 ENCOUNTER — Other Ambulatory Visit: Payer: Self-pay | Admitting: Family Medicine

## 2023-07-02 DIAGNOSIS — Z419 Encounter for procedure for purposes other than remedying health state, unspecified: Secondary | ICD-10-CM | POA: Diagnosis not present

## 2023-07-06 ENCOUNTER — Ambulatory Visit: Payer: Medicaid Other | Admitting: Family Medicine

## 2023-07-14 ENCOUNTER — Ambulatory Visit (INDEPENDENT_AMBULATORY_CARE_PROVIDER_SITE_OTHER): Payer: Medicaid Other | Admitting: Gastroenterology

## 2023-07-30 DIAGNOSIS — Z419 Encounter for procedure for purposes other than remedying health state, unspecified: Secondary | ICD-10-CM | POA: Diagnosis not present

## 2023-09-09 ENCOUNTER — Other Ambulatory Visit: Payer: Self-pay | Admitting: Family Medicine

## 2023-09-10 DIAGNOSIS — Z419 Encounter for procedure for purposes other than remedying health state, unspecified: Secondary | ICD-10-CM | POA: Diagnosis not present

## 2023-09-12 ENCOUNTER — Telehealth: Payer: Self-pay

## 2023-09-12 NOTE — Telephone Encounter (Signed)
 Patient is requesting labs ordered. Please advise.

## 2023-09-14 ENCOUNTER — Other Ambulatory Visit: Payer: Self-pay

## 2023-09-14 DIAGNOSIS — I10 Essential (primary) hypertension: Secondary | ICD-10-CM

## 2023-09-14 DIAGNOSIS — E785 Hyperlipidemia, unspecified: Secondary | ICD-10-CM

## 2023-09-14 DIAGNOSIS — R739 Hyperglycemia, unspecified: Secondary | ICD-10-CM

## 2023-10-06 ENCOUNTER — Other Ambulatory Visit: Payer: Self-pay | Admitting: Family Medicine

## 2023-10-07 ENCOUNTER — Other Ambulatory Visit: Payer: Self-pay

## 2023-10-07 MED ORDER — LOSARTAN POTASSIUM 100 MG PO TABS: 100.0000 mg | ORAL_TABLET | Freq: Every day | ORAL | 3 refills | Status: AC

## 2023-10-10 DIAGNOSIS — Z419 Encounter for procedure for purposes other than remedying health state, unspecified: Secondary | ICD-10-CM | POA: Diagnosis not present

## 2023-11-10 DIAGNOSIS — Z419 Encounter for procedure for purposes other than remedying health state, unspecified: Secondary | ICD-10-CM | POA: Diagnosis not present

## 2023-11-15 ENCOUNTER — Other Ambulatory Visit: Payer: Self-pay | Admitting: Acute Care

## 2023-11-15 DIAGNOSIS — Z87891 Personal history of nicotine dependence: Secondary | ICD-10-CM

## 2023-11-15 DIAGNOSIS — I10 Essential (primary) hypertension: Secondary | ICD-10-CM | POA: Diagnosis not present

## 2023-11-15 DIAGNOSIS — F17210 Nicotine dependence, cigarettes, uncomplicated: Secondary | ICD-10-CM

## 2023-11-15 DIAGNOSIS — R739 Hyperglycemia, unspecified: Secondary | ICD-10-CM | POA: Diagnosis not present

## 2023-11-15 DIAGNOSIS — Z122 Encounter for screening for malignant neoplasm of respiratory organs: Secondary | ICD-10-CM

## 2023-11-15 DIAGNOSIS — E785 Hyperlipidemia, unspecified: Secondary | ICD-10-CM | POA: Diagnosis not present

## 2023-11-16 LAB — COMPREHENSIVE METABOLIC PANEL WITH GFR
ALT: 21 IU/L (ref 0–32)
AST: 18 IU/L (ref 0–40)
Albumin: 4.5 g/dL (ref 3.9–4.9)
Alkaline Phosphatase: 99 IU/L (ref 44–121)
BUN/Creatinine Ratio: 15 (ref 12–28)
BUN: 9 mg/dL (ref 8–27)
Bilirubin Total: 0.5 mg/dL (ref 0.0–1.2)
CO2: 26 mmol/L (ref 20–29)
Calcium: 9.9 mg/dL (ref 8.7–10.3)
Chloride: 95 mmol/L — ABNORMAL LOW (ref 96–106)
Creatinine, Ser: 0.61 mg/dL (ref 0.57–1.00)
Globulin, Total: 2.5 g/dL (ref 1.5–4.5)
Glucose: 111 mg/dL — ABNORMAL HIGH (ref 70–99)
Potassium: 4.9 mmol/L (ref 3.5–5.2)
Sodium: 138 mmol/L (ref 134–144)
Total Protein: 7 g/dL (ref 6.0–8.5)
eGFR: 100 mL/min/{1.73_m2} (ref >59)

## 2023-11-16 LAB — LIPID PANEL
Chol/HDL Ratio: 2.8 ratio (ref 0.0–4.4)
Cholesterol, Total: 187 mg/dL (ref 100–199)
HDL: 66 mg/dL (ref >39)
LDL Chol Calc (NIH): 96 mg/dL (ref 0–99)
Triglycerides: 147 mg/dL (ref 0–149)
VLDL Cholesterol Cal: 25 mg/dL (ref 5–40)

## 2023-11-16 LAB — CBC
Hematocrit: 50.3 % — ABNORMAL HIGH (ref 34.0–46.6)
Hemoglobin: 16.4 g/dL — ABNORMAL HIGH (ref 11.1–15.9)
MCH: 31.7 pg (ref 26.6–33.0)
MCHC: 32.6 g/dL (ref 31.5–35.7)
MCV: 97 fL (ref 79–97)
Platelets: 263 10*3/uL (ref 150–450)
RBC: 5.18 x10E6/uL (ref 3.77–5.28)
RDW: 14 % (ref 11.7–15.4)
WBC: 8.1 10*3/uL (ref 3.4–10.8)

## 2023-11-16 LAB — MICROALBUMIN / CREATININE URINE RATIO
Creatinine, Urine: 76 mg/dL
Microalb/Creat Ratio: 50 mg/g{creat} — ABNORMAL HIGH (ref 0–29)
Microalbumin, Urine: 38.1 ug/mL

## 2023-11-16 LAB — HEMOGLOBIN A1C
Est. average glucose Bld gHb Est-mCnc: 120 mg/dL
Hgb A1c MFr Bld: 5.8 % — ABNORMAL HIGH (ref 4.8–5.6)

## 2023-11-20 ENCOUNTER — Ambulatory Visit: Payer: Self-pay | Admitting: Family Medicine

## 2023-11-27 ENCOUNTER — Other Ambulatory Visit: Payer: Self-pay | Admitting: Family Medicine

## 2023-11-27 MED ORDER — ATORVASTATIN CALCIUM 80 MG PO TABS: 80.0000 mg | ORAL_TABLET | Freq: Every day | ORAL | 3 refills | Status: AC

## 2023-12-09 ENCOUNTER — Ambulatory Visit (HOSPITAL_COMMUNITY)

## 2023-12-10 DIAGNOSIS — Z419 Encounter for procedure for purposes other than remedying health state, unspecified: Secondary | ICD-10-CM | POA: Diagnosis not present

## 2023-12-29 ENCOUNTER — Ambulatory Visit (HOSPITAL_COMMUNITY)
Admission: RE | Admit: 2023-12-29 | Discharge: 2023-12-29 | Disposition: A | Discharge status: Home / Self Care | Source: Ambulatory Visit | Attending: Acute Care | Admitting: Acute Care

## 2023-12-29 DIAGNOSIS — Z122 Encounter for screening for malignant neoplasm of respiratory organs: Secondary | ICD-10-CM | POA: Insufficient documentation

## 2023-12-29 DIAGNOSIS — J439 Emphysema, unspecified: Secondary | ICD-10-CM | POA: Diagnosis not present

## 2023-12-29 DIAGNOSIS — I2510 Atherosclerotic heart disease of native coronary artery without angina pectoris: Secondary | ICD-10-CM | POA: Diagnosis not present

## 2023-12-29 DIAGNOSIS — I700 Atherosclerosis of aorta: Secondary | ICD-10-CM | POA: Diagnosis not present

## 2023-12-29 DIAGNOSIS — F17210 Nicotine dependence, cigarettes, uncomplicated: Secondary | ICD-10-CM | POA: Diagnosis not present

## 2023-12-29 DIAGNOSIS — Z87891 Personal history of nicotine dependence: Secondary | ICD-10-CM | POA: Diagnosis present

## 2024-01-04 ENCOUNTER — Ambulatory Visit (INDEPENDENT_AMBULATORY_CARE_PROVIDER_SITE_OTHER): Admitting: Family Medicine

## 2024-01-04 VITALS — BP 124/62 | HR 72 | Temp 97.9°F | Ht 64.0 in | Wt 211.2 lb

## 2024-01-04 DIAGNOSIS — J449 Chronic obstructive pulmonary disease, unspecified: Secondary | ICD-10-CM

## 2024-01-04 DIAGNOSIS — Z23 Encounter for immunization: Secondary | ICD-10-CM | POA: Diagnosis not present

## 2024-01-04 DIAGNOSIS — F17210 Nicotine dependence, cigarettes, uncomplicated: Secondary | ICD-10-CM | POA: Diagnosis not present

## 2024-01-04 DIAGNOSIS — Z0000 Encounter for general adult medical examination without abnormal findings: Secondary | ICD-10-CM

## 2024-01-04 DIAGNOSIS — Z0001 Encounter for general adult medical examination with abnormal findings: Secondary | ICD-10-CM | POA: Diagnosis not present

## 2024-01-04 MED ORDER — TRELEGY ELLIPTA 100-62.5-25 MCG/ACT IN AEPB: 1.0000 | INHALATION_SPRAY | Freq: Every day | RESPIRATORY_TRACT | 11 refills | Status: DC

## 2024-01-04 MED ORDER — VARENICLINE TARTRATE 1 MG PO TABS: 1.0000 mg | ORAL_TABLET | Freq: Two times a day (BID) | ORAL | 1 refills | Status: AC

## 2024-01-04 NOTE — Patient Instructions (Addendum)
 Chantix : Days 1 to 3: 0.5 mg once daily. Days 4 to 7: 0.5 mg twice daily. Maintenance (day 8 and later): 1 mg twice daily;  Trelegy sent in.  Follow up in 6 months.

## 2024-01-05 ENCOUNTER — Telehealth: Payer: Self-pay

## 2024-01-05 ENCOUNTER — Other Ambulatory Visit: Payer: Self-pay

## 2024-01-05 DIAGNOSIS — J449 Chronic obstructive pulmonary disease, unspecified: Secondary | ICD-10-CM

## 2024-01-05 DIAGNOSIS — Z0000 Encounter for general adult medical examination without abnormal findings: Secondary | ICD-10-CM | POA: Insufficient documentation

## 2024-01-05 MED ORDER — TRELEGY ELLIPTA 100-62.5-25 MCG/ACT IN AEPB: 1.0000 | INHALATION_SPRAY | Freq: Every day | RESPIRATORY_TRACT | 11 refills | Status: AC

## 2024-01-05 NOTE — Assessment & Plan Note (Signed)
 Stable currently.  Needs to quit smoking.  Rx sent for Trelegy.

## 2024-01-05 NOTE — Telephone Encounter (Signed)
 Prescription Request  01/05/2024  LOV: Visit date not found  What is the name of the medication or equipment? Fluticasone -Umeclidin-Vilant (TRELEGY ELLIPTA ) 100-62.5-25 MCG/ACT AEPB   Have you contacted your pharmacy to request a refill? Yes   Which pharmacy would you like this sent to?  [facility] [address], [place] - [address] [place] [address] Phone: [phone] Fax: [phone]    Patient notified that their request is being sent to the clinical staff for review and that they should receive a response within 2 business days.   Please advise at Mobile [phone] (mobile)

## 2024-01-05 NOTE — Progress Notes (Signed)
 Subjective:  Patient ID: Monique Harmon, female    DOB: 1961/04/26  Age: 63 y.o. MRN: [id number]  CC:  Physical   HPI:  63 year old female with the below mentioned medical problems presents for an annual exam.  Patient overall stable.  Patient reports that she is still smoking.  Will discuss restarting Chantix .  Patient recently had lung cancer screening.  Result not yet available.  Patient in need of pneumococcal vaccine.  She is amenable to this today.  Declines shingles vaccine.  We do not yet have flu vaccines.  Patient currently on Dulera for COPD.  Would like to restart Trelegy.    Patient reports that she been having some pain to the left arm/shoulder and also pain of her left lower extremity.  Seems to be worse with movement/activity.  No recent fall, trauma, injury.  No relieving factors.  Patient Active Problem List   Diagnosis Date Noted   Annual physical exam 01/05/2024   Knee pain 12/01/2022   Coronary artery calcification seen on CT scan 11/26/2022   Chronic respiratory failure with hypoxia (HCC) 06/21/2022   Irritable bowel syndrome with both constipation and diarrhea 04/08/2022   Primary hypertension 03/16/2022   Hyperlipidemia 03/16/2022   COPD GOLD ? / active smoker AB 03/16/2022   Cigarette smoker 03/16/2022    Social Hx   Social History   Socioeconomic History   Marital status: Single    Spouse name: Not on file   Number of children: Not on file   Years of education: Not on file   Highest education level: Not on file  Occupational History   Not on file  Tobacco Use   Smoking status: Every Day    Current packs/day: 1.00    Average packs/day: 1 pack/day for 49.0 years (49.0 ttl pk-yrs)    Types: Cigarettes    Passive exposure: Current   Smokeless tobacco: Never   Tobacco comments:    Recently tried Chantix  for the third time and failed.   Vaping Use   Vaping status: Never Used  Substance and Sexual Activity   Alcohol use: Yes    Alcohol/week:  28.0 standard drinks of alcohol    Types: 28 Cans of beer per week    Comment: drinks 4 beers every day   Drug use: Not Currently   Sexual activity: Not Currently    Birth control/protection: Post-menopausal, Surgical  Other Topics Concern   Not on file  Social History Narrative   Not on file   Social Drivers of Health   Financial Resource Strain: Not on file  Food Insecurity: Not on file  Transportation Needs: Not on file  Physical Activity: Not on file  Stress: Not on file  Social Connections: Not on file    Review of Systems Per HPI  Objective:  BP 124/62   Pulse 72   Temp 97.9 F (36.6 C)   Ht 5' 4 (1.626 m)   Wt 211 lb 3.2 oz (95.8 kg)   SpO2 91%   BMI 36.25 kg/m      01/04/2024    3:00 PM 04/05/2023    1:53 PM 12/01/2022   10:23 AM  BP/Weight  Systolic BP 124 129 114  Diastolic BP 62 78 60  Wt. (Lbs) 211.2 209 205  BMI 36.25 kg/m2 35.87 kg/m2 35.19 kg/m2    Physical Exam Vitals and nursing note reviewed.  Constitutional:      General: She is not in acute distress.    Appearance: Normal appearance. She  is obese.  HENT:     Head: Normocephalic and atraumatic.  Cardiovascular:     Rate and Rhythm: Normal rate and regular rhythm.  Pulmonary:     Effort: Pulmonary effort is normal.     Breath sounds: Normal breath sounds. No wheezing, rhonchi or rales.  Musculoskeletal:     Comments: Left shoulder: Inspection reveals no abnormalities, atrophy or asymmetry. Palpation is normal with no tenderness over AC joint or bicipital groove. ROM is full in all planes. Rotator cuff strength normal throughout.    Neurological:     Mental Status: She is alert.  Psychiatric:        Mood and Affect: Mood normal.        Behavior: Behavior normal.     Lab Results  Component Value Date   WBC 8.1 11/15/2023   HGB 16.4 (H) 11/15/2023   HCT 50.3 (H) 11/15/2023   PLT 263 11/15/2023   GLUCOSE 111 (H) 11/15/2023   CHOL 187 11/15/2023   TRIG 147 11/15/2023   HDL  66 11/15/2023   LDLCALC 96 11/15/2023   ALT 21 11/15/2023   AST 18 11/15/2023   NA 138 11/15/2023   K 4.9 11/15/2023   CL 95 (L) 11/15/2023   CREATININE 0.61 11/15/2023   BUN 9 11/15/2023   CO2 26 11/15/2023   HGBA1C 5.8 (H) 11/15/2023     Assessment & Plan:  Annual physical exam Assessment & Plan: Awaiting results of CT lung cancer screening.  Pneumococcal vaccine given today.  Patient to continue to work on smoking cessation.   Immunization due -     Pneumococcal conjugate vaccine 20-valent  COPD GOLD ? / active smoker AB Assessment & Plan: Stable currently.  Needs to quit smoking.  Rx sent for Trelegy.  Orders: -     Trelegy Ellipta ; Inhale 1 puff into the lungs daily.  Dispense: 1 each; Refill: 11  Cigarette smoker Assessment & Plan: Restarting Chantix .  Orders: -     Varenicline  Tartrate; Take 1 tablet (1 mg total) by mouth 2 (two) times daily.  Dispense: 180 tablet; Refill: 1    Follow-up:  Return in about 6 months (around 07/06/2024).  Jacqulyn Ahle DO [facility]

## 2024-01-05 NOTE — Assessment & Plan Note (Signed)
 Awaiting results of CT lung cancer screening.  Pneumococcal vaccine given today.  Patient to continue to work on smoking cessation.

## 2024-01-05 NOTE — Assessment & Plan Note (Signed)
Restarting Chantix 

## 2024-01-06 ENCOUNTER — Telehealth: Payer: Self-pay | Admitting: Pharmacy Technician

## 2024-01-06 ENCOUNTER — Other Ambulatory Visit (HOSPITAL_COMMUNITY): Payer: Self-pay

## 2024-01-06 NOTE — Telephone Encounter (Signed)
 Pharmacy Patient Advocate Encounter  Received notification from [facility] that Prior Authorization for Trelegy Ellipta  100-62.5-25MCG/ACT aerosol powder  has been APPROVED from 12/23/2023 to 01/05/2025. Ran test claim, Copay is $4.00. This test claim was processed through [facility]- copay amounts may vary at other pharmacies due to pharmacy/plan contracts, or as the patient moves through the different stages of their insurance plan.   PA #/Case ID/Reference #: [id number]

## 2024-01-06 NOTE — Telephone Encounter (Signed)
 Pharmacy Patient Advocate Encounter   Received notification from [employer]base that prior authorization for Trelegy Ellipta  100-62.5-25MCG/ACT aerosol powder  is required/requested.   Insurance verification completed.   The patient is insured through [facility] [place] .   Per test claim: PA required; PA submitted to above mentioned insurance via LAT[employer] Key/confirmation #/[id number] Status is pending

## 2024-01-10 DIAGNOSIS — Z419 Encounter for procedure for purposes other than remedying health state, unspecified: Secondary | ICD-10-CM | POA: Diagnosis not present

## 2024-01-12 ENCOUNTER — Other Ambulatory Visit: Payer: Self-pay | Admitting: Acute Care

## 2024-01-12 DIAGNOSIS — F17210 Nicotine dependence, cigarettes, uncomplicated: Secondary | ICD-10-CM

## 2024-01-12 DIAGNOSIS — Z87891 Personal history of nicotine dependence: Secondary | ICD-10-CM

## 2024-01-12 DIAGNOSIS — Z122 Encounter for screening for malignant neoplasm of respiratory organs: Secondary | ICD-10-CM

## 2024-02-07 ENCOUNTER — Ambulatory Visit: Admitting: Family Medicine

## 2024-02-10 DIAGNOSIS — Z419 Encounter for procedure for purposes other than remedying health state, unspecified: Secondary | ICD-10-CM | POA: Diagnosis not present

## 2024-03-04 ENCOUNTER — Other Ambulatory Visit: Payer: Self-pay | Admitting: Family Medicine

## 2024-03-11 DIAGNOSIS — Z419 Encounter for procedure for purposes other than remedying health state, unspecified: Secondary | ICD-10-CM | POA: Diagnosis not present

## 2024-03-14 ENCOUNTER — Encounter (INDEPENDENT_AMBULATORY_CARE_PROVIDER_SITE_OTHER): Payer: Self-pay | Admitting: Gastroenterology

## 2024-07-06 ENCOUNTER — Other Ambulatory Visit: Payer: Self-pay | Admitting: Family Medicine

## 2024-07-10 ENCOUNTER — Ambulatory Visit: Admitting: Family Medicine
